# Patient Record
Sex: Female | Born: 1970 | Race: Black or African American | Hispanic: No | Marital: Married | Smoking: Never smoker
Health system: Southern US, Community
[De-identification: ages and names within clinical notes are randomized; demographics above are authoritative.]

## PROBLEM LIST (undated history)

## (undated) DIAGNOSIS — Z973 Presence of spectacles and contact lenses: Secondary | ICD-10-CM

## (undated) DIAGNOSIS — D259 Leiomyoma of uterus, unspecified: Secondary | ICD-10-CM

## (undated) DIAGNOSIS — J45909 Unspecified asthma, uncomplicated: Secondary | ICD-10-CM

## (undated) DIAGNOSIS — F419 Anxiety disorder, unspecified: Secondary | ICD-10-CM

## (undated) DIAGNOSIS — D509 Iron deficiency anemia, unspecified: Secondary | ICD-10-CM

## (undated) HISTORY — PX: NO PAST SURGERIES: SHX2092

---

## 2006-12-30 ENCOUNTER — Ambulatory Visit: Payer: Self-pay | Admitting: *Deleted

## 2006-12-30 ENCOUNTER — Encounter (INDEPENDENT_AMBULATORY_CARE_PROVIDER_SITE_OTHER): Payer: Self-pay | Admitting: Nurse Practitioner

## 2006-12-30 ENCOUNTER — Ambulatory Visit: Payer: Self-pay | Admitting: Family Medicine

## 2006-12-30 LAB — CONVERTED CEMR LAB
ALT: 14 units/L (ref 0–35)
AST: 17 units/L (ref 0–37)
Albumin: 4.3 g/dL (ref 3.5–5.2)
Alkaline Phosphatase: 60 units/L (ref 39–117)
Basophils Absolute: 0 10*3/uL (ref 0.0–0.1)
Basophils Relative: 1 % (ref 0–1)
Chloride: 106 meq/L (ref 96–112)
Eosinophils Absolute: 0.1 10*3/uL (ref 0.0–0.7)
HDL: 43 mg/dL (ref >39)
LDL Cholesterol: 130 mg/dL — ABNORMAL HIGH (ref 0–99)
MCHC: 32.9 g/dL (ref 30.0–36.0)
MCV: 64.7 fL — ABNORMAL LOW (ref 78.0–100.0)
Neutro Abs: 3.4 10*3/uL (ref 1.7–7.7)
Neutrophils Relative %: 51 % (ref 43–77)
Platelets: 262 10*3/uL (ref 150–400)
Potassium: 4.3 meq/L (ref 3.5–5.3)
Sodium: 140 meq/L (ref 135–145)
TSH: 1.847 microintl units/mL (ref 0.350–5.50)
Total Protein: 7.7 g/dL (ref 6.0–8.3)

## 2007-01-31 ENCOUNTER — Encounter (INDEPENDENT_AMBULATORY_CARE_PROVIDER_SITE_OTHER): Payer: Self-pay | Admitting: Nurse Practitioner

## 2007-01-31 ENCOUNTER — Ambulatory Visit: Payer: Self-pay | Admitting: Internal Medicine

## 2007-01-31 LAB — CONVERTED CEMR LAB: Preg, Serum: POSITIVE

## 2008-08-13 ENCOUNTER — Ambulatory Visit: Payer: Self-pay | Admitting: Internal Medicine

## 2008-08-16 ENCOUNTER — Ambulatory Visit (HOSPITAL_COMMUNITY): Admission: RE | Admit: 2008-08-16 | Discharge: 2008-08-16 | Payer: Self-pay | Admitting: Internal Medicine

## 2008-11-30 ENCOUNTER — Inpatient Hospital Stay (HOSPITAL_COMMUNITY): Admission: AD | Admit: 2008-11-30 | Discharge: 2008-11-30 | Payer: Self-pay | Admitting: Obstetrics & Gynecology

## 2008-12-03 ENCOUNTER — Encounter (INDEPENDENT_AMBULATORY_CARE_PROVIDER_SITE_OTHER): Payer: Self-pay | Admitting: Adult Health

## 2008-12-03 ENCOUNTER — Ambulatory Visit: Payer: Self-pay | Admitting: Internal Medicine

## 2008-12-03 LAB — CONVERTED CEMR LAB
ALT: 13 units/L (ref 0–35)
Alkaline Phosphatase: 56 units/L (ref 39–117)
Basophils Relative: 1 % (ref 0–1)
CO2: 21 meq/L (ref 19–32)
Lymphocytes Relative: 38 % (ref 12–46)
MCHC: 31.1 g/dL (ref 30.0–36.0)
Monocytes Relative: 9 % (ref 3–12)
Neutro Abs: 3.8 10*3/uL (ref 1.7–7.7)
Neutrophils Relative %: 51 % (ref 43–77)
RBC: 5.33 M/uL — ABNORMAL HIGH (ref 3.87–5.11)
Sodium: 137 meq/L (ref 135–145)
TSH: 2.192 microintl units/mL (ref 0.350–4.500)
Total Bilirubin: 0.5 mg/dL (ref 0.3–1.2)
Total Protein: 8 g/dL (ref 6.0–8.3)
WBC: 7.5 10*3/uL (ref 4.0–10.5)

## 2009-01-02 ENCOUNTER — Encounter (INDEPENDENT_AMBULATORY_CARE_PROVIDER_SITE_OTHER): Payer: Self-pay | Admitting: Adult Health

## 2009-01-02 ENCOUNTER — Ambulatory Visit: Payer: Self-pay | Admitting: Internal Medicine

## 2009-01-02 LAB — CONVERTED CEMR LAB
Basophils Relative: 0 % (ref 0–1)
Eosinophils Absolute: 0.1 10*3/uL (ref 0.0–0.7)
Hemoglobin: 9.7 g/dL — ABNORMAL LOW (ref 12.0–15.0)
MCHC: 32.2 g/dL (ref 30.0–36.0)
MCV: 61.3 fL — ABNORMAL LOW (ref 78.0–100.0)
Monocytes Absolute: 1.1 10*3/uL — ABNORMAL HIGH (ref 0.1–1.0)
Monocytes Relative: 13 % — ABNORMAL HIGH (ref 3–12)
RBC: 4.91 M/uL (ref 3.87–5.11)

## 2009-02-07 ENCOUNTER — Encounter (INDEPENDENT_AMBULATORY_CARE_PROVIDER_SITE_OTHER): Payer: Self-pay | Admitting: Internal Medicine

## 2009-02-07 ENCOUNTER — Ambulatory Visit: Payer: Self-pay | Admitting: Internal Medicine

## 2009-02-07 LAB — CONVERTED CEMR LAB
Basophils Absolute: 0 10*3/uL (ref 0.0–0.1)
Eosinophils Absolute: 0.1 10*3/uL (ref 0.0–0.7)
Eosinophils Relative: 1 % (ref 0–5)
HCT: 32.3 % — ABNORMAL LOW (ref 36.0–46.0)
Lymphs Abs: 2.7 10*3/uL (ref 0.7–4.0)
MCV: 65 fL — ABNORMAL LOW (ref 78.0–100.0)
Platelets: 221 10*3/uL (ref 150–400)
RDW: 25.3 % — ABNORMAL HIGH (ref 11.5–15.5)

## 2010-06-10 IMAGING — RF DG BE W/ CM - WO/W KUB
9 series · 9 of 9 positions shown · non-contrast
Comparison: None.

CLINICAL DATA: 37-year-old female with history of constipation.

AIR CONTRAST BARIUM ENEMA
TECHNIQUE: Initial scout AP supine abdominal image obtained to
insure adequate colon cleansing. Barium was introduced into the
colon in a retrograde fashion and refluxed from the rectum to the
distal transverse colon. As much of the barium as possible was then
removed through the indwelling tube via gravity drain. Air was then
insufflated into the colon. Spot images of the colon followed by
overhead radiographs were obtained.
Fluoroscopy time:  2.2 minutes

[Series 1: run · 1 of 1 slices shown (1 of 9)]
[im 1/1]
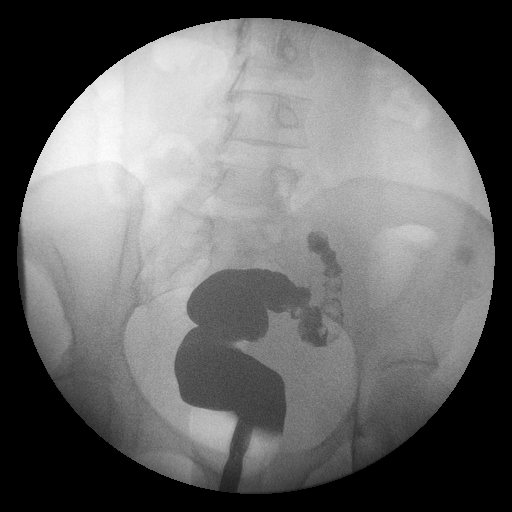

[Series 2: run · 1 of 1 slices shown (2 of 9)]
[im 1/1]
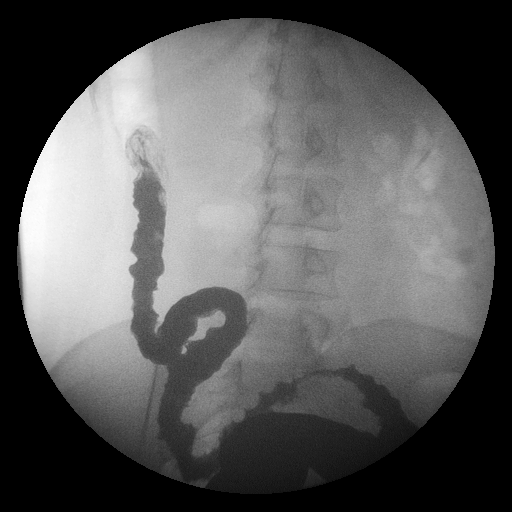

[Series 3: run · 1 of 1 slices shown (3 of 9)]
[im 1/1]
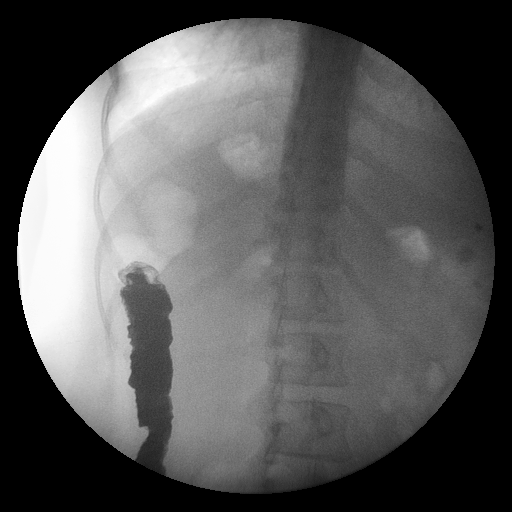

[Series 4: run · 1 of 1 slices shown (4 of 9)]
[im 1/1]
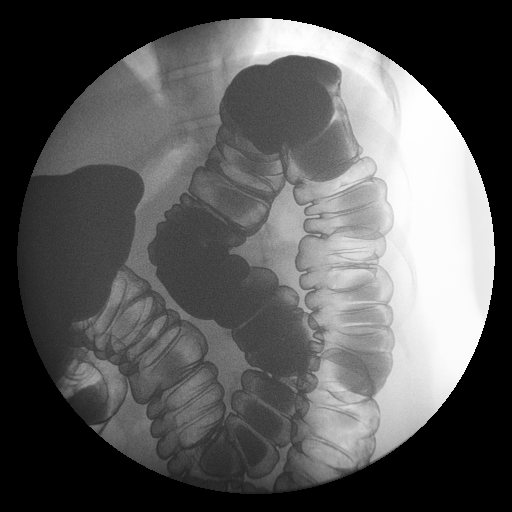

[Series 5: run · 1 of 1 slices shown (5 of 9)]
[im 1/1]
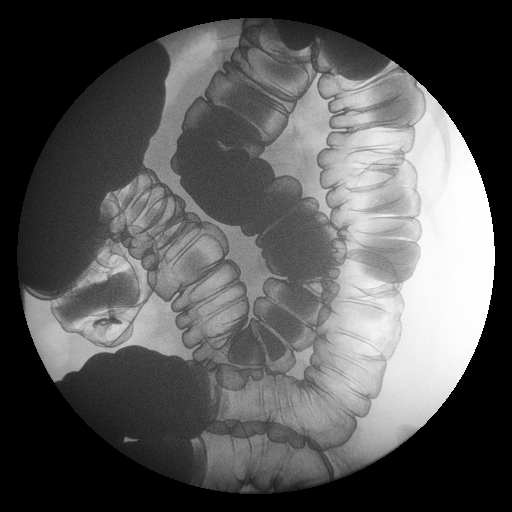

[Series 6: run · 1 of 1 slices shown (6 of 9)]
[im 1/1]
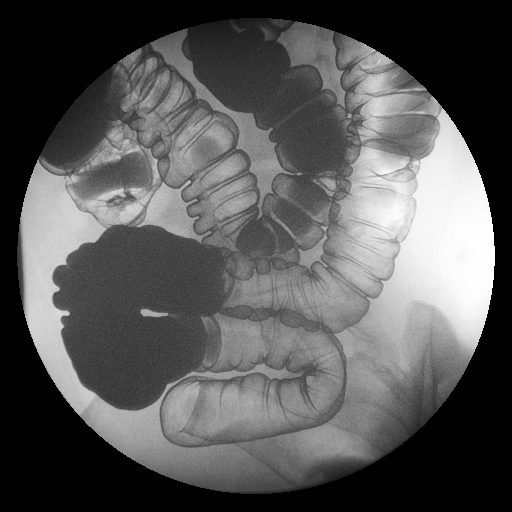

[Series 7: run · 1 of 1 slices shown (7 of 9)]
[im 1/1]
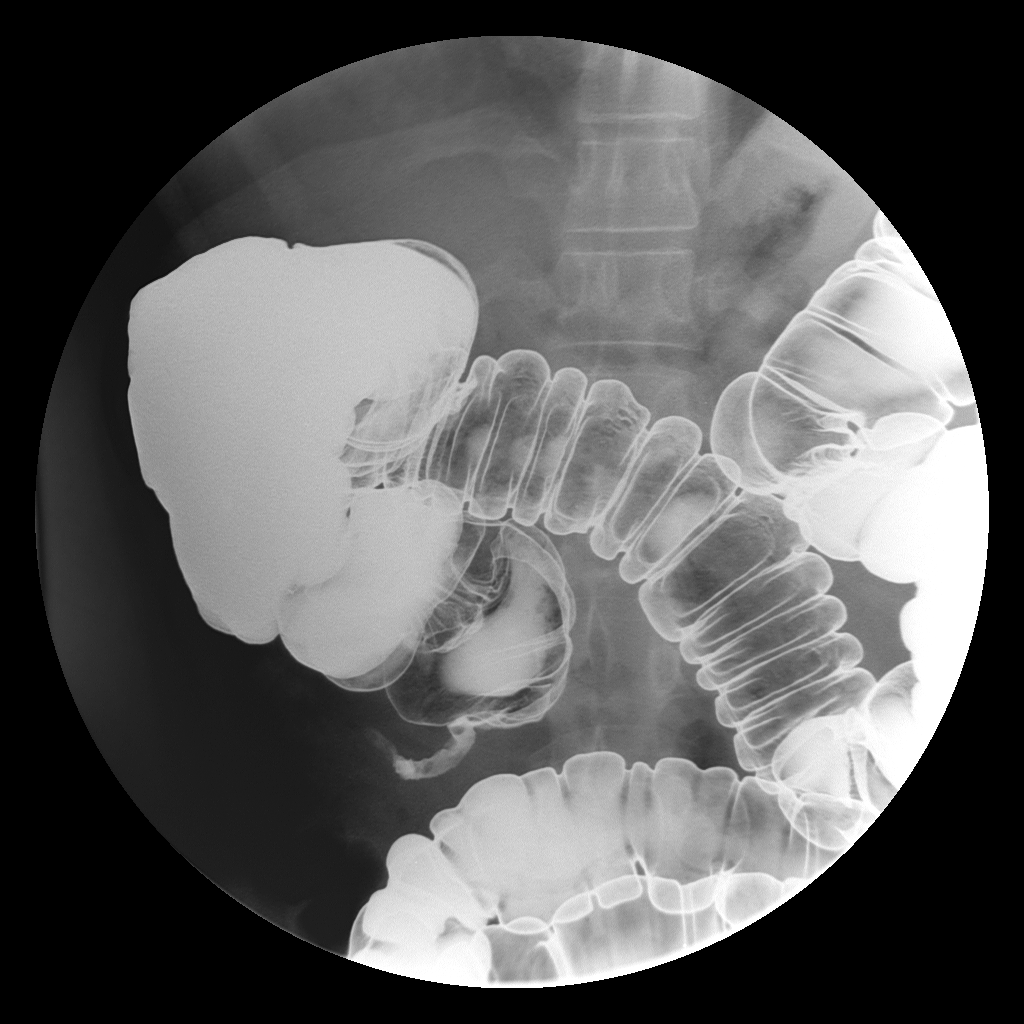

[Series 8: run · 1 of 1 slices shown (8 of 9)]
[im 1/1]
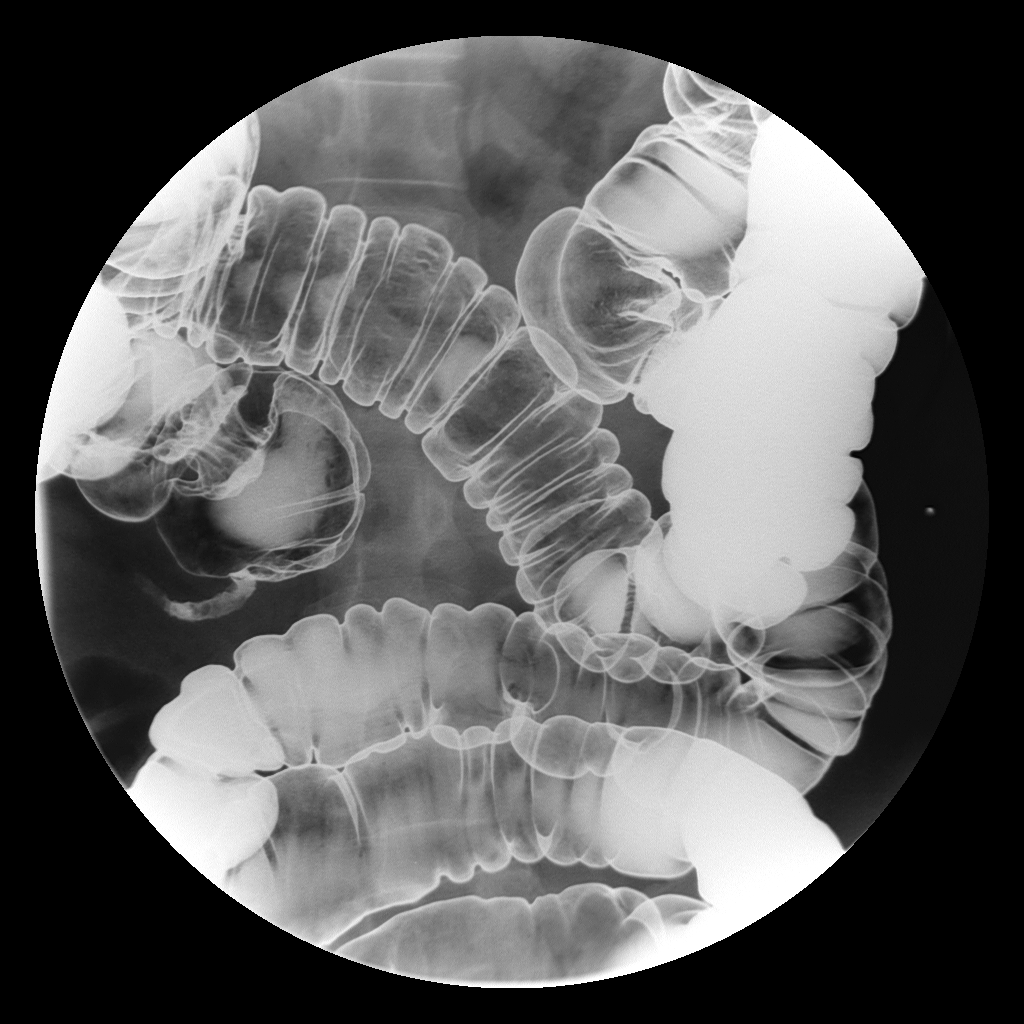

[Series 9: run · 1 of 1 slices shown (9 of 9)]
[im 1/1]
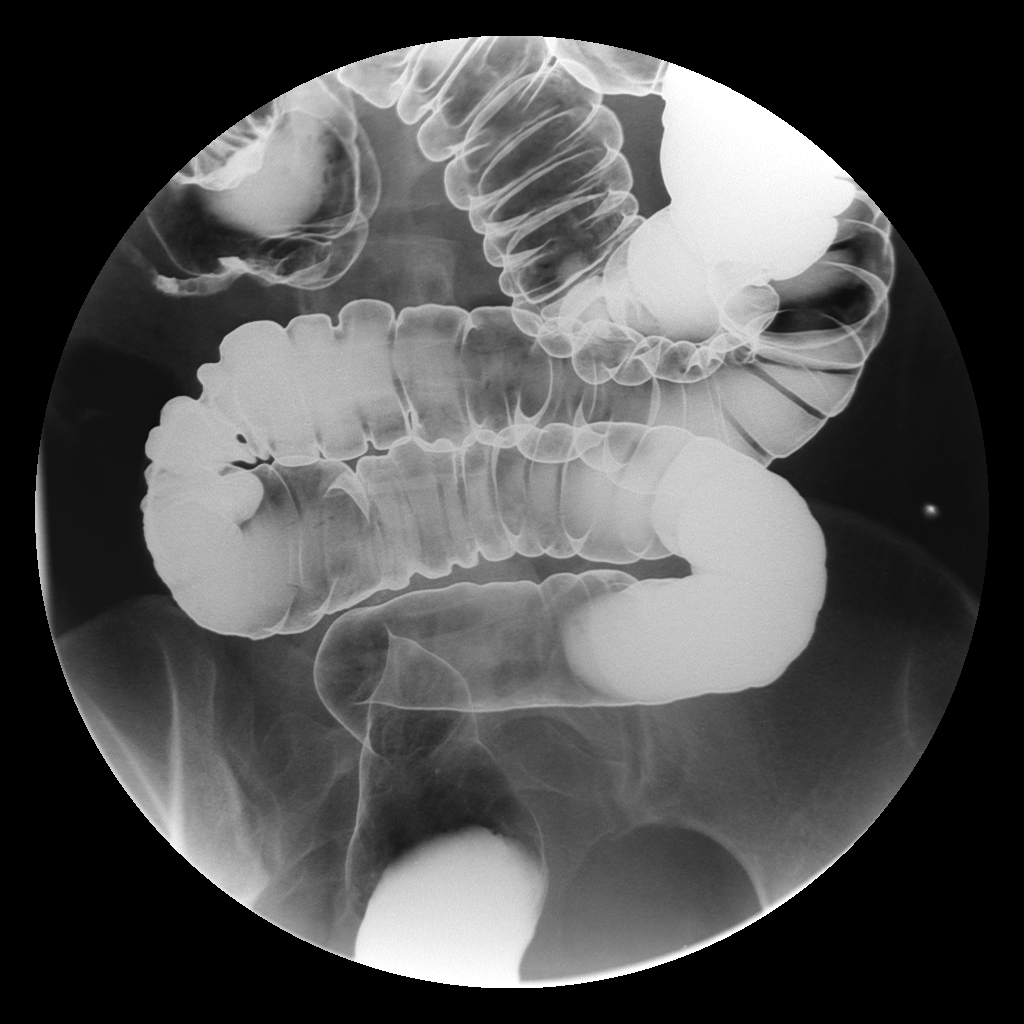

[9 of 9 positions shown; findings below may reference images not displayed]

FINDINGS: Preprocedural AP scout view of the abdomen demonstrate a
normal bowel gas pattern without significant retained stool.
Visceral contours are within normal limits.  There is mild rotatory
lumbar scoliosis. No acute osseous abnormality identified.

The patient speaks French and her husband was utilized to
translate.  A double contrast study was undertaken and the patient
tolerated this well.  There was no obstruction to the retrograde
flow of contrast throughout the colon.  Moderate to severely
redundant colon was noted, particularly the sigmoid and transverse
segments.  Barium was allowed to drain and air was introduced.
Suboptimal drainage of barium occurred, in part due to the
redundancy.

Multiple double contrast overhead and fluoroscopic spot views of
the colon were then obtained. Reflux of contrast into the appendix
is noted.  There is no small bowel reflux.  No contour deforming
mass, stricture, or polyp is identified.  No diverticula.

The patient was given appropriate post procedural instructions and
milk of magnesia to facilitate evacuation of the residual barium.
She was discharged to home in good condition.
IMPRESSION: Negative aside from moderate to severe redundancy of the colon,
particularly the sigmoid and transverse segment.

## 2014-04-24 ENCOUNTER — Inpatient Hospital Stay (HOSPITAL_COMMUNITY): Admission: RE | Admit: 2014-04-24 | Payer: Self-pay | Source: Ambulatory Visit

## 2014-05-16 ENCOUNTER — Encounter (HOSPITAL_BASED_OUTPATIENT_CLINIC_OR_DEPARTMENT_OTHER): Payer: Self-pay | Admitting: *Deleted

## 2014-05-17 ENCOUNTER — Encounter (HOSPITAL_BASED_OUTPATIENT_CLINIC_OR_DEPARTMENT_OTHER): Payer: Self-pay | Admitting: *Deleted

## 2014-05-17 NOTE — Progress Notes (Addendum)
NPO AFTER MN. ARRIVE AT [id number]. NEEDS CBC, CMET, AND URINE PREG.  AT FIRST I USED PACIFIC INTERPRETOR BUT PT STATES SHE UNDERSTANDS ENGLISH AND DID NOT  NEED INTERPRETOR AND WANTS TO TALK TO HER [facility]ANCE , [employer], I WAS ABLE TO GET HISTORY DONE BUT WILL CALL  HIM WITH DIRECTIONS AND TIME.  SHE ALSO REFUSED NEEDING INTERPRETOR DOS , STATES [employer] CAN INTERPRET DOS IF NEEDED. CALL AND SPOKE W/ HEATHER AT OFFICE SHE STATES THEY MOSTLY HAVE BEEN SPEAKING TO [employer] PER PT WANTS. HEATHER STATES THEY ARE STILL WORKING ON HER INSURANCE APPROVAL FOR SURGERY, SHE IS TO CALL BACK LATER UPDATE.   HAVE NOT HEARD BACK FROM PT [employer]ANCE. CALLED PT BACK VERIFIED SHE HAD PHYSICAL ADDRESS AND DIRECTIONS.  ALSO, HAVE NOT HEARD FROM OFFICE IF INSURANCE APPROVED SURGERY.

## 2014-05-20 ENCOUNTER — Ambulatory Visit (HOSPITAL_BASED_OUTPATIENT_CLINIC_OR_DEPARTMENT_OTHER): Admission: RE | Admit: 2014-05-20 | Payer: 59 | Source: Ambulatory Visit | Admitting: Obstetrics & Gynecology

## 2014-05-20 HISTORY — DX: Presence of spectacles and contact lenses: Z97.3

## 2014-05-20 HISTORY — DX: Leiomyoma of uterus, unspecified: D25.9

## 2014-05-20 HISTORY — DX: Iron deficiency anemia, unspecified: D50.9

## 2014-05-20 SURGERY — HYSTERECTOMY, ABDOMINAL
Anesthesia: Choice

## 2014-06-24 NOTE — H&P (Signed)
Katelyn Lawson is an 44 y.o. female with fibroid uterus and anemia.  She was counseled regarding tx options including medication (OCP, Depo Lupron) vs [place] vs myomectomy/hysterectomy.  The patient has elected to proceed with hysterectomy as she has definitely completed childbearing.  Uterus is about 18 weeks sized and the patient has had no previous abdominal surgeries.  Pertinent Gynecological History: Menses: flow is excessive with use of 8 pads or tampons on heaviest days Bleeding: heavy Contraception: none DES exposure: unknown Blood transfusions: none Sexually transmitted diseases: no past history Previous GYN Procedures: none  Last MMG: normal Date: 2015 Last pap: normal Date: 2014 OB History: G2, P2   Menstrual History: Menarche age: n/a No LMP recorded.    Past Medical History  Diagnosis Date  . Uterine fibroid   . Iron deficiency anemia   . Wears glasses     Past Surgical History  Procedure Laterality Date  . No past surgeries      No family history on file.  Social History:  reports that she has never smoked. She has never used smokeless tobacco. She reports that she does not drink alcohol or use illicit drugs.  Allergies: No Known Allergies  No prescriptions prior to admission    ROS  There were no vitals taken for this visit. Physical Exam  Constitutional: She is oriented to person, place, and time. She appears well-developed and well-nourished.  GI: Soft. She exhibits mass. There is no rebound and no guarding.  Neurological: She is alert and oriented to person, place, and time.  Skin: Skin is warm and dry.  Psychiatric: She has a normal mood and affect. Her behavior is normal.    No results found for this or any previous visit (from the past 24 hour(s)).  No results found.  Assessment/Plan: 44yo G2P2 with enlarged fibroid uterus and anemia -TAH -Patient is counseled re: risk of bleeding, infection, scarring and damage to surrounding organs.  The  patient understands that this completes her childbearing potential.    Bathsheba Durrett 06/24/2014, 8:56 PM

## 2014-06-25 NOTE — Progress Notes (Signed)
SPOKE W/ PT AND FIANCE, PT DOES NOT SPEAK ENGLISH WELL, SO FIANCE, [employer] WILL INTERPRET DOS.  NPO AFTER MN. ARRIVE AT [id number]. NEEDS CBC, CMET, URINE PREG.

## 2014-06-27 ENCOUNTER — Ambulatory Visit (HOSPITAL_BASED_OUTPATIENT_CLINIC_OR_DEPARTMENT_OTHER): Payer: 59 | Admitting: Anesthesiology

## 2014-06-27 ENCOUNTER — Encounter (HOSPITAL_BASED_OUTPATIENT_CLINIC_OR_DEPARTMENT_OTHER): Payer: Self-pay | Admitting: *Deleted

## 2014-06-27 ENCOUNTER — Encounter (HOSPITAL_COMMUNITY)
Admission: RE | Disposition: A | Discharge status: Home / Self Care | Payer: Self-pay | Source: Ambulatory Visit | Attending: Obstetrics & Gynecology

## 2014-06-27 ENCOUNTER — Inpatient Hospital Stay (HOSPITAL_BASED_OUTPATIENT_CLINIC_OR_DEPARTMENT_OTHER)
Admission: RE | Admit: 2014-06-27 | Discharge: 2014-06-29 | DRG: 743 | Disposition: A | Discharge status: Home / Self Care | Payer: 59 | Source: Ambulatory Visit | Attending: Obstetrics & Gynecology | Admitting: Obstetrics & Gynecology

## 2014-06-27 DIAGNOSIS — D509 Iron deficiency anemia, unspecified: Secondary | ICD-10-CM | POA: Diagnosis present

## 2014-06-27 DIAGNOSIS — N924 Excessive bleeding in the premenopausal period: Secondary | ICD-10-CM | POA: Diagnosis present

## 2014-06-27 DIAGNOSIS — Z90710 Acquired absence of both cervix and uterus: Secondary | ICD-10-CM | POA: Diagnosis present

## 2014-06-27 DIAGNOSIS — D259 Leiomyoma of uterus, unspecified: Principal | ICD-10-CM | POA: Diagnosis present

## 2014-06-27 HISTORY — PX: ABDOMINAL HYSTERECTOMY: SHX81

## 2014-06-27 LAB — COMPREHENSIVE METABOLIC PANEL
ALBUMIN: 4.4 g/dL (ref 3.5–5.2)
ALT: 25 U/L (ref 0–35)
ANION GAP: 9 (ref 5–15)
AST: 25 U/L (ref 0–37)
Alkaline Phosphatase: 58 U/L (ref 39–117)
BILIRUBIN TOTAL: 0.5 mg/dL (ref 0.3–1.2)
BUN: 12 mg/dL (ref 6–23)
CALCIUM: 9.1 mg/dL (ref 8.4–10.5)
CHLORIDE: 105 mmol/L (ref 96–112)
CO2: 26 mmol/L (ref 19–32)
CREATININE: 0.74 mg/dL (ref 0.50–1.10)
GFR calc Af Amer: >90 mL/min (ref >90)
GFR calc non Af Amer: >90 mL/min (ref >90)
Glucose, Bld: 95 mg/dL (ref 70–99)
Potassium: 3.8 mmol/L (ref 3.5–5.1)
Sodium: 140 mmol/L (ref 135–145)
Total Protein: 7.8 g/dL (ref 6.0–8.3)

## 2014-06-27 LAB — CBC
HCT: 33.6 % — ABNORMAL LOW (ref 36.0–46.0)
HEMOGLOBIN: 11.5 g/dL — AB (ref 12.0–15.0)
MCH: 24.4 pg — ABNORMAL LOW (ref 26.0–34.0)
MCHC: 34.2 g/dL (ref 30.0–36.0)
MCV: 71.3 fL — ABNORMAL LOW (ref 78.0–100.0)
PLATELETS: 235 10*3/uL (ref 150–400)
RBC: 4.71 MIL/uL (ref 3.87–5.11)
RDW: 19 % — ABNORMAL HIGH (ref 11.5–15.5)
WBC: 6.4 10*3/uL (ref 4.0–10.5)

## 2014-06-27 LAB — TYPE AND SCREEN
ABO/RH(D): A POS
ANTIBODY SCREEN: NEGATIVE

## 2014-06-27 LAB — POCT PREGNANCY, URINE: Preg Test, Ur: NEGATIVE

## 2014-06-27 LAB — ABO/RH: ABO/RH(D): A POS

## 2014-06-27 SURGERY — HYSTERECTOMY, ABDOMINAL
Anesthesia: General | Site: Abdomen

## 2014-06-27 MED ORDER — DEXTROSE IN LACTATED RINGERS 5 % IV SOLN
INTRAVENOUS | Status: DC
Start: 2014-06-27 — End: 2014-06-29
  Administered 2014-06-27: 14:00:00 via INTRAVENOUS
  Administered 2014-06-27: 1000 mL via INTRAVENOUS
  Administered 2014-06-28: 125 mL/h via INTRAVENOUS

## 2014-06-27 MED ORDER — METOCLOPRAMIDE HCL 5 MG/ML IJ SOLN
INTRAMUSCULAR | Status: DC | PRN
  Administered 2014-06-27: 10 mg via INTRAVENOUS

## 2014-06-27 MED ORDER — CEFAZOLIN SODIUM-DEXTROSE 2-3 GM-% IV SOLR
2.0000 g | INTRAVENOUS | Status: AC
  Administered 2014-06-27: 2 g via INTRAVENOUS
  Filled 2014-06-27: qty 50

## 2014-06-27 MED ORDER — SCOPOLAMINE 1 MG/3DAYS TD PT72
MEDICATED_PATCH | TRANSDERMAL | Status: AC
  Filled 2014-06-27: qty 1

## 2014-06-27 MED ORDER — ONDANSETRON HCL 4 MG/2ML IJ SOLN
INTRAMUSCULAR | Status: DC | PRN
  Administered 2014-06-27: 4 mg via INTRAVENOUS

## 2014-06-27 MED ORDER — LIDOCAINE HCL (CARDIAC) 20 MG/ML IV SOLN
INTRAVENOUS | Status: DC | PRN
  Administered 2014-06-27: 60 mg via INTRAVENOUS

## 2014-06-27 MED ORDER — KETOROLAC TROMETHAMINE 30 MG/ML IJ SOLN
INTRAMUSCULAR | Status: DC | PRN
  Administered 2014-06-27: 30 mg via INTRAVENOUS

## 2014-06-27 MED ORDER — BISACODYL 10 MG RE SUPP: 10.0000 mg | Freq: Every day | RECTAL | Status: DC | PRN

## 2014-06-27 MED ORDER — DEXTROSE IN LACTATED RINGERS 5 % IV SOLN
INTRAVENOUS | Status: DC
  Filled 2014-06-27: qty 1000

## 2014-06-27 MED ORDER — OXYCODONE HCL 5 MG/5ML PO SOLN
5.0000 mg | Freq: Once | ORAL | Status: DC | PRN
  Filled 2014-06-27: qty 5

## 2014-06-27 MED ORDER — LACTATED RINGERS IV SOLN
INTRAVENOUS | Status: DC
  Administered 2014-06-27 (×3): via INTRAVENOUS
  Filled 2014-06-27: qty 1000

## 2014-06-27 MED ORDER — SCOPOLAMINE 1 MG/3DAYS TD PT72
1.0000 | MEDICATED_PATCH | TRANSDERMAL | Status: DC
  Administered 2014-06-27: 1.5 mg via TRANSDERMAL
  Filled 2014-06-27: qty 1

## 2014-06-27 MED ORDER — SODIUM CHLORIDE 0.9 % IV SOLN
INTRAVENOUS | Status: DC | PRN
  Administered 2014-06-27: 40 mL

## 2014-06-27 MED ORDER — PROPOFOL 10 MG/ML IV BOLUS
INTRAVENOUS | Status: DC | PRN
  Administered 2014-06-27: 200 mg via INTRAVENOUS

## 2014-06-27 MED ORDER — GLYCOPYRROLATE 0.2 MG/ML IJ SOLN
INTRAMUSCULAR | Status: DC | PRN
  Administered 2014-06-27: 0.6 mg via INTRAVENOUS

## 2014-06-27 MED ORDER — DOCUSATE SODIUM 100 MG PO CAPS
100.0000 mg | ORAL_CAPSULE | Freq: Two times a day (BID) | ORAL | Status: DC
  Administered 2014-06-27 – 2014-06-29 (×4): 100 mg via ORAL
  Filled 2014-06-27 (×5): qty 1

## 2014-06-27 MED ORDER — SIMETHICONE 80 MG PO CHEW
80.0000 mg | CHEWABLE_TABLET | Freq: Four times a day (QID) | ORAL | Status: DC | PRN
  Filled 2014-06-27: qty 1

## 2014-06-27 MED ORDER — FENTANYL CITRATE 0.05 MG/ML IJ SOLN
INTRAMUSCULAR | Status: AC
  Filled 2014-06-27: qty 4

## 2014-06-27 MED ORDER — HYDROMORPHONE HCL 1 MG/ML IJ SOLN
INTRAMUSCULAR | Status: AC
  Filled 2014-06-27: qty 1

## 2014-06-27 MED ORDER — ACETAMINOPHEN 10 MG/ML IV SOLN
INTRAVENOUS | Status: DC | PRN
  Administered 2014-06-27: 1000 mg via INTRAVENOUS

## 2014-06-27 MED ORDER — ONDANSETRON HCL 4 MG/2ML IJ SOLN: 4.0000 mg | Freq: Four times a day (QID) | INTRAMUSCULAR | Status: DC | PRN

## 2014-06-27 MED ORDER — OXYCODONE HCL 5 MG PO TABS
5.0000 mg | ORAL_TABLET | Freq: Once | ORAL | Status: DC | PRN
  Filled 2014-06-27: qty 1

## 2014-06-27 MED ORDER — KETOROLAC TROMETHAMINE 30 MG/ML IJ SOLN
30.0000 mg | Freq: Four times a day (QID) | INTRAMUSCULAR | Status: DC
  Administered 2014-06-27 – 2014-06-28 (×5): 30 mg via INTRAVENOUS
  Filled 2014-06-27 (×11): qty 1

## 2014-06-27 MED ORDER — ROCURONIUM BROMIDE 100 MG/10ML IV SOLN
INTRAVENOUS | Status: DC | PRN
  Administered 2014-06-27: 10 mg via INTRAVENOUS
  Administered 2014-06-27: 40 mg via INTRAVENOUS

## 2014-06-27 MED ORDER — MENTHOL 3 MG MT LOZG
1.0000 | LOZENGE | OROMUCOSAL | Status: DC | PRN
  Filled 2014-06-27: qty 9

## 2014-06-27 MED ORDER — ALUM & MAG HYDROXIDE-SIMETH 200-200-20 MG/5ML PO SUSP
30.0000 mL | ORAL | Status: DC | PRN
Start: 2014-06-27 — End: 2014-06-29
  Administered 2014-06-29: 30 mL via ORAL
  Filled 2014-06-27: qty 30

## 2014-06-27 MED ORDER — CEFAZOLIN SODIUM-DEXTROSE 2-3 GM-% IV SOLR
INTRAVENOUS | Status: AC
  Filled 2014-06-27: qty 50

## 2014-06-27 MED ORDER — LIDOCAINE HCL 4 % MT SOLN
OROMUCOSAL | Status: DC | PRN
  Administered 2014-06-27: 3 mL via TOPICAL

## 2014-06-27 MED ORDER — MAGNESIUM HYDROXIDE 400 MG/5ML PO SUSP
30.0000 mL | Freq: Every day | ORAL | Status: DC | PRN
  Administered 2014-06-28: 30 mL via ORAL
  Filled 2014-06-27: qty 30

## 2014-06-27 MED ORDER — KETOROLAC TROMETHAMINE 30 MG/ML IJ SOLN
30.0000 mg | Freq: Four times a day (QID) | INTRAMUSCULAR | Status: DC
  Filled 2014-06-27 (×8): qty 1

## 2014-06-27 MED ORDER — HYDROMORPHONE HCL 1 MG/ML IJ SOLN
0.2500 mg | INTRAMUSCULAR | Status: DC | PRN
  Administered 2014-06-27 (×3): 0.25 mg via INTRAVENOUS
  Filled 2014-06-27: qty 1

## 2014-06-27 MED ORDER — ONDANSETRON HCL 4 MG PO TABS: 4.0000 mg | ORAL_TABLET | Freq: Four times a day (QID) | ORAL | Status: DC | PRN

## 2014-06-27 MED ORDER — HYDROMORPHONE HCL 1 MG/ML IJ SOLN
0.2000 mg | INTRAMUSCULAR | Status: DC | PRN
  Administered 2014-06-27 (×2): 0.6 mg via INTRAVENOUS
  Filled 2014-06-27 (×2): qty 1

## 2014-06-27 MED ORDER — SODIUM CHLORIDE 0.9 % IV SOLN
Freq: Once | INTRAVENOUS | Status: DC
  Filled 2014-06-27: qty 1000

## 2014-06-27 MED ORDER — MIDAZOLAM HCL 2 MG/2ML IJ SOLN
INTRAMUSCULAR | Status: AC
  Filled 2014-06-27: qty 2

## 2014-06-27 MED ORDER — PROMETHAZINE HCL 25 MG/ML IJ SOLN
6.2500 mg | INTRAMUSCULAR | Status: DC | PRN
Start: 2014-06-27 — End: 2014-06-27
  Filled 2014-06-27: qty 1

## 2014-06-27 MED ORDER — FENTANYL CITRATE 0.05 MG/ML IJ SOLN
INTRAMUSCULAR | Status: DC | PRN
  Administered 2014-06-27 (×8): 50 ug via INTRAVENOUS

## 2014-06-27 MED ORDER — MIDAZOLAM HCL 5 MG/5ML IJ SOLN
INTRAMUSCULAR | Status: DC | PRN
  Administered 2014-06-27: 2 mg via INTRAVENOUS

## 2014-06-27 MED ORDER — NEOSTIGMINE METHYLSULFATE 10 MG/10ML IV SOLN
INTRAVENOUS | Status: DC | PRN
  Administered 2014-06-27: 4 mg via INTRAVENOUS

## 2014-06-27 MED ORDER — OXYCODONE-ACETAMINOPHEN 5-325 MG PO TABS
1.0000 | ORAL_TABLET | ORAL | Status: DC | PRN
  Administered 2014-06-28 – 2014-06-29 (×2): 2 via ORAL
  Filled 2014-06-27 (×2): qty 2

## 2014-06-27 MED ORDER — DEXAMETHASONE SODIUM PHOSPHATE 4 MG/ML IJ SOLN
INTRAMUSCULAR | Status: DC | PRN
Start: 2014-06-27 — End: 2014-06-27
  Administered 2014-06-27: 10 mg via INTRAVENOUS

## 2014-06-27 SURGICAL SUPPLY — 65 items
BAG URINE DRAINAGE (UROLOGICAL SUPPLIES) ×2 IMPLANT
BENZOIN TINCTURE PRP APPL 2/3 (GAUZE/BANDAGES/DRESSINGS) ×2 IMPLANT
BLADE CLIPPER SURG (BLADE) IMPLANT
BLADE HEX COATED 2.75 (ELECTRODE) ×2 IMPLANT
CANISTER SUCT 3000ML (MISCELLANEOUS) ×2 IMPLANT
CATH FOLEY 2WAY SLVR  5CC 14FR (CATHETERS) ×1
CATH FOLEY 2WAY SLVR 5CC 14FR (CATHETERS) ×1 IMPLANT
COVER MAYO STAND STRL (DRAPES) ×2 IMPLANT
COVER TABLE BACK 60X90 (DRAPES) ×2 IMPLANT
DECANTER SPIKE VIAL GLASS SM (MISCELLANEOUS) IMPLANT
DRAPE LAPAROTOMY TRNSV 102X78 (DRAPE) ×2 IMPLANT
DRAPE WARM FLUID 44X44 (DRAPE) IMPLANT
DRESSING TELFA ISLAND 4X8 (GAUZE/BANDAGES/DRESSINGS) IMPLANT
DRSG OPSITE 6X11 MED (GAUZE/BANDAGES/DRESSINGS) ×2 IMPLANT
DRSG OPSITE POSTOP 4X10 (GAUZE/BANDAGES/DRESSINGS) ×2 IMPLANT
DURAPREP 26ML APPLICATOR (WOUND CARE) ×2 IMPLANT
ELECT BLADE 6.5 .24CM SHAFT (ELECTRODE) IMPLANT
ELECT LIGASURE LONG (ELECTRODE) ×2 IMPLANT
ELECT REM PT RETURN 9FT ADLT (ELECTROSURGICAL) ×2
ELECTRODE REM PT RTRN 9FT ADLT (ELECTROSURGICAL) ×1 IMPLANT
GAUZE SPONGE 4X4 16PLY XRAY LF (GAUZE/BANDAGES/DRESSINGS) IMPLANT
GLOVE BIO SURGEON STRL SZ 6 (GLOVE) ×2 IMPLANT
GLOVE BIOGEL M 6.5 STRL (GLOVE) ×4 IMPLANT
GLOVE BIOGEL PI IND STRL 6 (GLOVE) ×3 IMPLANT
GLOVE BIOGEL PI IND STRL 7.0 (GLOVE) ×3 IMPLANT
GLOVE BIOGEL PI IND STRL 7.5 (GLOVE) ×2 IMPLANT
GLOVE BIOGEL PI INDICATOR 6 (GLOVE) ×3
GLOVE BIOGEL PI INDICATOR 7.0 (GLOVE) ×3
GLOVE BIOGEL PI INDICATOR 7.5 (GLOVE) ×2
GLOVE SURG ORTHO 8.0 STRL STRW (GLOVE) ×2 IMPLANT
GOWN STRL REUS W/TWL LRG LVL3 (GOWN DISPOSABLE) ×4 IMPLANT
GOWN STRL REUS W/TWL XL LVL3 (GOWN DISPOSABLE) ×4 IMPLANT
HEMOSTAT SURGICEL 4X8 (HEMOSTASIS) IMPLANT
HOLDER FOLEY CATH W/STRAP (MISCELLANEOUS) ×2 IMPLANT
NEEDLE HYPO 22GX1.5 SAFETY (NEEDLE) ×2 IMPLANT
NEEDLE HYPO 25X1 1.5 SAFETY (NEEDLE) IMPLANT
NS IRRIG 500ML POUR BTL (IV SOLUTION) ×6 IMPLANT
PACK BASIN DAY SURGERY FS (CUSTOM PROCEDURE TRAY) ×2 IMPLANT
PAD OB MATERNITY 4.3X12.25 (PERSONAL CARE ITEMS) ×2 IMPLANT
PAD PREP 24X48 CUFFED NSTRL (MISCELLANEOUS) ×2 IMPLANT
PENCIL BUTTON HOLSTER BLD 10FT (ELECTRODE) ×2 IMPLANT
SPONGE GAUZE 4X4 12PLY STER LF (GAUZE/BANDAGES/DRESSINGS) ×2 IMPLANT
SPONGE LAP 18X18 X RAY DECT (DISPOSABLE) ×6 IMPLANT
STAPLER VISISTAT 35W (STAPLE) IMPLANT
STRIP CLOSURE SKIN 1/2X4 (GAUZE/BANDAGES/DRESSINGS) ×2 IMPLANT
SUT CHROMIC 2 0 TIES 18 (SUTURE) IMPLANT
SUT MNCRL 0 MO-4 VIOLET 18 CR (SUTURE) ×1 IMPLANT
SUT MNCRL 0 VIOLET 6X18 (SUTURE) ×1 IMPLANT
SUT MON AB 2-0 CT1 36 (SUTURE) IMPLANT
SUT MON AB-0 CT1 36 (SUTURE) ×2 IMPLANT
SUT MONOCRYL 0 6X18 (SUTURE) ×1
SUT MONOCRYL 0 MO 4 18  CR/8 (SUTURE) ×1
SUT PDS AB 0 CTX 60 (SUTURE) IMPLANT
SUT PLAIN 2 0 XLH (SUTURE) IMPLANT
SUT VIC AB 0 CT1 36 (SUTURE) ×2 IMPLANT
SUT VIC AB 3-0 X1 27 (SUTURE) IMPLANT
SUT VIC AB 4-0 KS 27 (SUTURE) ×2 IMPLANT
SYR BULB IRRIGATION 50ML (SYRINGE) ×2 IMPLANT
SYR CONTROL 10ML LL (SYRINGE) ×2 IMPLANT
SYRINGE 10CC LL (SYRINGE) ×2 IMPLANT
TOWEL OR 17X24 6PK STRL BLUE (TOWEL DISPOSABLE) ×6 IMPLANT
TRAY DSU PREP LF (CUSTOM PROCEDURE TRAY) ×2 IMPLANT
TUBE CONNECTING 12X1/4 (SUCTIONS) ×2 IMPLANT
WATER STERILE IRR 500ML POUR (IV SOLUTION) ×2 IMPLANT
YANKAUER SUCT BULB TIP NO VENT (SUCTIONS) ×2 IMPLANT

## 2014-06-27 NOTE — Progress Notes (Signed)
No change to H&P. 

## 2014-06-27 NOTE — Anesthesia Procedure Notes (Signed)
Procedure Name: Intubation Date/Time: 06/27/2014 7:34 AM Performed by: Katelyn Lawson Pre-anesthesia Checklist: Patient identified, Emergency Drugs available, Suction available and Patient being monitored Patient Re-evaluated:Patient Re-evaluated prior to inductionOxygen Delivery Method: Circle System Utilized Preoxygenation: Pre-oxygenation with 100% oxygen Intubation Type: IV induction Ventilation: Mask ventilation without difficulty Laryngoscope Size: Mac and 3 Grade View: Grade I Tube type: Oral Tube size: 7.0 mm Number of attempts: 1 Airway Equipment and Method: Stylet and LTA kit utilized Placement Confirmation: ETT inserted through vocal cords under direct vision,  positive ETCO2 and breath sounds checked- equal and bilateral Secured at: 21 cm Tube secured with: Tape Dental Injury: Teeth and Oropharynx as per pre-operative assessment

## 2014-06-27 NOTE — Anesthesia Postprocedure Evaluation (Signed)
  Anesthesia Post-op Note  Patient: Katelyn Lawson  Procedure(s) Performed: Procedure(s): TOTAL HYSTERECTOMY ABDOMINAL (N/A)  Patient Location: PACU  Anesthesia Type:General  Level of Consciousness: awake and alert   Airway and Oxygen Therapy: Patient Spontanous Breathing  Post-op Pain: mild  Post-op Assessment: Post-op Vital signs reviewed  Post-op Vital Signs: Reviewed  Last Vitals:  Filed Vitals:   06/27/14 1246  BP: 117/64  Pulse: 69  Temp: 36.7 C  Resp: 16    Complications: No apparent anesthesia complications

## 2014-06-27 NOTE — Anesthesia Preprocedure Evaluation (Addendum)
Anesthesia Evaluation  Patient identified by MRN, date of birth, ID band Patient awake    Reviewed: Allergy & Precautions, NPO status , Patient's Chart, lab work & pertinent test results  Airway Mallampati: II  TM Distance: >3 FB Neck ROM: Full    Dental  (+) Teeth Intact, Dental Advisory Given   Pulmonary neg pulmonary ROS,  breath sounds clear to auscultation        Cardiovascular negative cardio ROS  Rhythm:Regular Rate:Normal     Neuro/Psych negative neurological ROS     GI/Hepatic negative GI ROS, Neg liver ROS,   Endo/Other  negative endocrine ROS  Renal/GU negative Renal ROS     Musculoskeletal negative musculoskeletal ROS (+)   Abdominal   Peds  Hematology  (+) anemia ,   Anesthesia Other Findings   Reproductive/Obstetrics                            Anesthesia Physical Anesthesia Plan  ASA: II  Anesthesia Plan: General   Post-op Pain Management:    Induction: Intravenous  Airway Management Planned: Oral ETT  Additional Equipment:   Intra-op Plan:   Post-operative Plan: Extubation in OR  Informed Consent: I have reviewed the patients History and Physical, chart, labs and discussed the procedure including the risks, benefits and alternatives for the proposed anesthesia with the patient or authorized representative who has indicated his/her understanding and acceptance.   Dental advisory given  Plan Discussed with: CRNA  Anesthesia Plan Comments:        Anesthesia Quick Evaluation

## 2014-06-27 NOTE — Transfer of Care (Signed)
Last Vitals:  Filed Vitals:   06/27/14 0623  BP: 107/62  Pulse: 63  Temp: 36.8 C  Resp: 16   Immediate Anesthesia Transfer of Care Note  Patient: Katelyn Lawson  Procedure(s) Performed: Procedure(s) (LRB): TOTAL HYSTERECTOMY ABDOMINAL (N/A)  Patient Location: PACU  Anesthesia Type: General  Level of Consciousness: awake, alert  and oriented  Airway & Oxygen Therapy: Patient Spontanous Breathing and Patient connected to nasal cannula oxygen  Post-op Assessment: Report given to PACU RN and Post -op Vital signs reviewed and stable  Post vital signs: Reviewed and stable  Complications: No apparent anesthesia complications

## 2014-06-27 NOTE — Progress Notes (Signed)
No current c/o.  No CP/SOB.  No nausea.  Good pain control.  VSS. AF. UOP clear yellow, >100cc/hr  Gen: A&O x 3 Abd: soft, mild distention.  Dressing clean and dry Ext: no c/c/e  POD#0 s/p TAH -Continue pain and nausea control -D/C Foley in am -AM labs pending -Saline lock in AM  Linda Hedges, DO

## 2014-06-27 NOTE — Op Note (Signed)
Katelyn Lawson PROCEDURE DATE: 06/27/2014  PREOPERATIVE DIAGNOSIS:  Symptomatic fibroids, anemia POSTOPERATIVE DIAGNOSIS:  Symptomatic fibroids, anemia SURGEON:   Dr. Linda Hedges  ASSISTANT:   Dr. Louretta Shorten OPERATION:  Total abdominal hysterectomy, bilateral salpingectomy ANESTHESIA:  General endotracheal.  INDICATIONS: The patient is a 44 y.o. with history of symptomatic uterine fibroids. The patient made a decision to undergo definite surgical treatment. On the preoperative visit, the risks, benefits, indications, and alternatives of the procedure were reviewed with the patient.  On the day of surgery, the risks of surgery were again discussed with the patient including but not limited to: bleeding which may require transfusion or reoperation; infection which may require antibiotics; injury to bowel, bladder, ureters or other surrounding organs; need for additional procedures; thromboembolic phenomenon, incisional problems and other postoperative/anesthesia complications. Written informed consent was obtained.    OPERATIVE FINDINGS: An 18 week size uterus with normal tubes and ovaries bilaterally.  ESTIMATED BLOOD LOSS: 300 ml SPECIMENS:  Uterus and fallopian tubes sent to pathology COMPLICATIONS:  None immediate.   DESCRIPTION OF PROCEDURE: The patient received intravenous antibiotics and had sequential compression devices applied to her lower extremities while in the preoperative area.   She was taken to the operating room and placed under general anesthesia without difficulty and found to be adequate.The abdomen and perineum were prepped and draped in a sterile manner, and a Foley catheter was inserted into the bladder and attached to constant drainage. After an adequate timeout was performed, a Pfannensteil skin incision was made. This incision was taken down to the fascia using electrocautery with care given to maintain good hemostasis. The fascia was incised in the midline and the fascial  incision was then extended bilaterally using electrocautery without difficulty. The fascia was then dissected off the underlying rectus muscles using blunt and sharp dissection. The rectus muscles were split bluntly in the midline and the peritoneum entered sharply without complication. This peritoneal incision was then extended superiorly and inferiorly with care given to prevent bowel or bladder injury. Upon entry into the abdominal cavity, the upper abdomen was inspected and found to be normal. Attention was then turned to the pelvis. The uterus was elevated through the incision.  Using the Ligasure, The round ligaments on each side were clamped, cauterized, and transected with sharply allowing entry into the broad ligament. The anterior and posterior leaves of the broad ligament were separated and the ureters were inspected to be safely away from the area of dissection bilaterally. A hole was created in the clear portion of the posterior broad ligament. Adnexae were clamped on the patient's right side. This pedicle was then clamped, cut, and doubly suture ligated with good hemostasis. This procedure was repeated in an identical fashion on the left site allowing for both adnexa to remain in place.  A bladder flap was then created across the anterior leaf of the broad ligament and the bladder was then bluntly dissected off the lower uterine segment and cervix with good hemostasis. The uterine arteries were then skeletonized bilaterally and then clamped, cauterized and cut.  After 2 clamps past the uterine vessels, the uterus was amputated to allow better visualization.  The cervix was then removed using the Ligasure. The uterosacral ligaments were then clamped, cut, and suture ligated bilaterally.  Finally, the cardinal ligaments were clamped, cut, and ligated bilaterally.  Acutely curved clamps were placed across the vagina just under the cervix, and the specimen was amputated and sent to pathology. The vaginal  cuff was closed  with a series of interrupted 0 monocryl figure-of-eight sutures with care given to incorporate the anterior pubocervical fascia and the posterior rectovaginal fascia.  The vaginal cuff angles were noted to have good hemostasis. The remaining fallopian tubes were identified, clamped, cauterized and removed using the Ligasure. The pelvis was irrigated and hemostasis was reconfirmed at all pedicles and along the pelvic sidewall.  All laparotomy sponges and instruments were removed from the abdomen. The peritoneum was closed with 2-0 Vicryl, and the fascia was closed with 0 Vicryl in a running fashion. 40cc of Exparel (half and half) was injecting into the fascia and subcutaneous tissue. The skin was closed with a 4-0 Monocryl subcuticular stitch. Sponge, lap, needle, and instrument counts were correct times two. The patient was taken to the recovery area awake, extubated and in stable condition.

## 2014-06-28 LAB — COMPREHENSIVE METABOLIC PANEL
ALK PHOS: 43 U/L (ref 39–117)
ALT: 18 U/L (ref 0–35)
ANION GAP: 6 (ref 5–15)
AST: 20 U/L (ref 0–37)
Albumin: 3 g/dL — ABNORMAL LOW (ref 3.5–5.2)
BUN: 7 mg/dL (ref 6–23)
CO2: 25 mmol/L (ref 19–32)
Calcium: 8.4 mg/dL (ref 8.4–10.5)
Chloride: 108 mmol/L (ref 96–112)
Creatinine, Ser: 0.68 mg/dL (ref 0.50–1.10)
Glucose, Bld: 130 mg/dL — ABNORMAL HIGH (ref 70–99)
POTASSIUM: 3.8 mmol/L (ref 3.5–5.1)
SODIUM: 139 mmol/L (ref 135–145)
Total Bilirubin: 0.8 mg/dL (ref 0.3–1.2)
Total Protein: 5.7 g/dL — ABNORMAL LOW (ref 6.0–8.3)

## 2014-06-28 LAB — CBC
HCT: 24.4 % — ABNORMAL LOW (ref 36.0–46.0)
Hemoglobin: 8.5 g/dL — ABNORMAL LOW (ref 12.0–15.0)
MCH: 24.7 pg — AB (ref 26.0–34.0)
MCHC: 34.8 g/dL (ref 30.0–36.0)
MCV: 70.9 fL — ABNORMAL LOW (ref 78.0–100.0)
Platelets: 185 10*3/uL (ref 150–400)
RBC: 3.44 MIL/uL — ABNORMAL LOW (ref 3.87–5.11)
RDW: 19 % — AB (ref 11.5–15.5)
WBC: 12 10*3/uL — ABNORMAL HIGH (ref 4.0–10.5)

## 2014-06-28 NOTE — Progress Notes (Signed)
Pt presents with activity intolerance. BP 99/39, pulse 89, O2 sat 97% on room air. On-call notified at [facility]. MD came to floor with no new orders. Will continue to monitor.

## 2014-06-28 NOTE — Progress Notes (Signed)
C/O pain which is well-controlled with medication.  Tolerating clears without n/v.  Minimal activity.  Voiding well.    VSS.  AF. Hgb 11.5->8.5  Gen: A&O x 3 Abd: soft, ND, dressing c/d Ext: no c/c/e  POD#1 s/p TAH -Advance diet -Increase activity as tolerated -Continue pain and nausea control -Anticipate d/c tomorrow  Linda Hedges, DO

## 2014-06-29 MED ORDER — DOCUSATE SODIUM 100 MG PO CAPS: 100.0000 mg | ORAL_CAPSULE | Freq: Two times a day (BID) | ORAL | Status: AC

## 2014-06-29 MED ORDER — OXYCODONE-ACETAMINOPHEN 5-325 MG PO TABS: 1.0000 | ORAL_TABLET | ORAL | Status: DC | PRN

## 2014-06-29 MED ORDER — SIMETHICONE 80 MG PO CHEW
80.0000 mg | CHEWABLE_TABLET | Freq: Four times a day (QID) | ORAL | Status: DC | PRN
Start: 2014-06-29 — End: 2017-02-01

## 2014-06-29 NOTE — Progress Notes (Signed)
Patient has no c/o.  Pain well controlled.  Passing flatus.  Tolerating po.  Voiding without difficulty.    VSS.  AF. Gen: A&O x 3 Abd: soft, NT/ND Ext: no c/c/e  POD#2 s/p TAH -D/C home with f/u in 1 week  Linda Hedges, DO

## 2014-06-29 NOTE — Discharge Summary (Signed)
Physician Discharge Summary  Patient ID: Katelyn Lawson MRN: [id number] DOB/AGE: 1970/11/05 44 y.o.  Admit date: 06/27/2014 Discharge date: 06/29/2014  Admission Diagnoses:  Fibroid uterus, anemia  Discharge Diagnoses: SAA Active Problems:   S/P hysterectomy   Discharged Condition: good  Hospital Course: Admitted to [facility] outpatient for planned TAH, b/l salpingectomy which was performed without complication.  The patient did well postop and was meeting all goals on POD#2.  She was discharged home with f/u in office in 1 week.  Consults: None  Significant Diagnostic Studies: none  Treatments: surgery: TAH, bilateral salpingectomy  Discharge Exam: Blood pressure 108/57, pulse 73, temperature 98.7 F (37.1 C), temperature source Oral, resp. rate 16, height 5\' 6"  (1.676 m), weight 180 lb (81.647 kg), last menstrual period 06/18/2014, SpO2 100 %. General appearance: alert, cooperative and appears stated age GI: normal findings: appropriate ttp Extremities: extremities normal, atraumatic, no cyanosis or edema Incision/Wound: c/d dressing  Disposition: Final discharge disposition not confirmed     Medication List    TAKE these medications        docusate sodium 100 MG capsule  Commonly known as:  COLACE  Take 1 capsule (100 mg total) by mouth 2 (two) times daily.     ferrous sulfate 325 (65 FE) MG tablet  Take 325 mg by mouth daily with breakfast.     fexofenadine 180 MG tablet  Commonly known as:  ALLEGRA  Take 180 mg by mouth daily.     oxyCODONE-acetaminophen 5-325 MG per tablet  Commonly known as:  PERCOCET/ROXICET  Take 1-2 tablets by mouth every 4 (four) hours as needed (moderate to severe pain (when tolerating fluids)).     simethicone 80 MG chewable tablet  Commonly known as:  MYLICON  Chew 1 tablet (80 mg total) by mouth 4 (four) times daily as needed for flatulence.     Tetrahydroz-Glyc-Hyprom-PEG 0.05-0.2-0.36-1 % Soln  Place 1 drop into both eyes as needed (for  red eyes).     VITAMIN B-12 PO  Take 1 tablet by mouth daily.     VITAMIN C PO  Take 1 tablet by mouth daily.         SignedLinda Hedges 06/29/2014, 12:36 PM

## 2014-06-29 NOTE — Discharge Instructions (Signed)
Call MD for T>100.4, heavy vaginal bleeding, severe abdominal pain, intractable nausea and/or vomiting, or respiratory distress.  No driving while taking pain medication.  No heavy lifting.  Pelvic rest x 6 weeks.  Call office to schedule incision check in 1 week.

## 2014-07-01 ENCOUNTER — Encounter (HOSPITAL_BASED_OUTPATIENT_CLINIC_OR_DEPARTMENT_OTHER): Payer: Self-pay | Admitting: Obstetrics & Gynecology

## 2015-11-12 DIAGNOSIS — R102 Pelvic and perineal pain: Secondary | ICD-10-CM | POA: Diagnosis not present

## 2015-11-12 DIAGNOSIS — N390 Urinary tract infection, site not specified: Secondary | ICD-10-CM | POA: Diagnosis not present

## 2015-12-25 ENCOUNTER — Ambulatory Visit: Payer: Self-pay | Admitting: General Surgery

## 2015-12-25 DIAGNOSIS — R221 Localized swelling, mass and lump, neck: Secondary | ICD-10-CM | POA: Diagnosis not present

## 2015-12-25 NOTE — H&P (Signed)
History of Present Illness Katelyn Ok MD; 12/25/2015 10:04 AM) The patient is a 45 year old female who presents with a complaint of Mass. Patient represents today after been seen earlier this year. Patient masses continued without resolution. Patient states she is ready for surgery at this time.  ----------------------------------------------------------------------  Patient is a 45 year old female who comes in and is referred by Dr. Velna Hatchet for evaluation of a right neck mass. Patient states there for approximately 2 years. He states it has become more bothersome. She states there has been no drainage or infection.   Allergies Elbert Ewings, CMA; 12/25/2015 9:52 AM) No Known Drug Allergies02/22/2017  Medication History Elbert Ewings, CMA; 12/25/2015 9:52 AM) Ferrous Sulfate (325 (65 Fe)MG Tablet, Oral) Active. Medications Reconciled    Review of Systems Katelyn Ok, MD; 12/25/2015 10:3 AM) General Not Present- Appetite Loss, Chills, Fatigue, Fever, Night Sweats, Weight Gain and Weight Loss. Skin Not Present- Change in Wart/Mole, Dryness, Hives, Jaundice, New Lesions, Non-Healing Wounds, Rash and Ulcer. HEENT Not Present- Earache, Hearing Loss, Hoarseness, Nose Bleed, Oral Ulcers, Ringing in the Ears, Seasonal Allergies, Sinus Pain, Sore Throat, Visual Disturbances, Wears glasses/contact lenses and Yellow Eyes. Respiratory Not Present- Bloody sputum, Chronic Cough, Difficulty Breathing, Snoring and Wheezing. Breast Not Present- Breast Mass, Breast Pain, Nipple Discharge and Skin Changes. Gastrointestinal Present- Constipation. Not Present- Abdominal Pain, Bloating, Bloody Stool, Change in Bowel Habits, Chronic diarrhea, Difficulty Swallowing, Excessive gas, Gets full quickly at meals, Hemorrhoids, Indigestion, Nausea, Rectal Pain and Vomiting. Female Genitourinary Not Present- Frequency, Nocturia, Painful Urination, Pelvic Pain and Urgency. Musculoskeletal Not Present-  Back Pain, Joint Pain, Joint Stiffness, Muscle Pain, Muscle Weakness and Swelling of Extremities. Neurological Not Present- Decreased Memory, Fainting, Headaches, Numbness, Seizures, Tingling, Tremor, Trouble walking and Weakness. Psychiatric Not Present- Anxiety, Bipolar, Change in Sleep Pattern, Depression, Fearful and Frequent crying. Endocrine Not Present- Cold Intolerance, Excessive Hunger, Hair Changes, Heat Intolerance, Hot flashes and New Diabetes. Hematology Not Present- Easy Bruising, Excessive bleeding, Gland problems, HIV and Persistent Infections.  Vitals Elbert Ewings CMA; 12/25/2015 9:53 AM) 12/25/2015 9:52 AM Weight: 180 lb Height: 67in Body Surface Area: 1.93 m Body Mass Index: 28.19 kg/m  Temp.: 97.57F(Temporal)  Pulse: 63 (Regular)  BP: 128/78 (Sitting, Left Arm, Standard)       Physical Exam Katelyn Ok, MD; 12/25/2015 10:3 AM) General Mental Status-Alert. General Appearance-Consistent with stated age. Hydration-Well hydrated. Voice-Normal.  Head and Neck Note: Approximately 3-4 cm right lateral neck mass, subcutaneous, mobile   Chest and Lung Exam Chest and lung exam reveals -quiet, even and easy respiratory effort with no use of accessory muscles and on auscultation, normal breath sounds, no adventitious sounds and normal vocal resonance. Inspection Chest Wall - Normal. Back - normal.  Cardiovascular Cardiovascular examination reveals -normal heart sounds, regular rate and rhythm with no murmurs and normal pedal pulses bilaterally.  Abdomen Inspection Normal Exam - No Hernias. Skin - Scar - no surgical scars. Palpation/Percussion Normal exam - Soft, Non Tender, No Rebound tenderness, No Rigidity (guarding) and No hepatosplenomegaly. Auscultation Normal exam - Bowel sounds normal.  Neurologic Neurologic evaluation reveals -alert and oriented x 3 with no impairment of recent or remote memory. Mental  Status-Normal.    Assessment & Plan Katelyn Ok MD; 12/25/2015 10:04 AM) NECK MASS (R22.1) Impression: 45 year old female with a right subcutaneous neck mass  1. The patient would like to proceed For Excision of Right Neck Mass. 2. Discussed with Her the Risks and Benefits the Procedure to Include but Not Limited  to: Infection, Bleeding, Damage to Structures, Possible Recurrence. Patient voiced Understanding and Wishes to Proceed.

## 2016-05-14 DIAGNOSIS — R8299 Other abnormal findings in urine: Secondary | ICD-10-CM | POA: Diagnosis not present

## 2016-05-14 DIAGNOSIS — Z0000 Encounter for general adult medical examination without abnormal findings: Secondary | ICD-10-CM | POA: Diagnosis not present

## 2016-05-21 DIAGNOSIS — Z0000 Encounter for general adult medical examination without abnormal findings: Secondary | ICD-10-CM | POA: Diagnosis not present

## 2016-05-21 DIAGNOSIS — K5900 Constipation, unspecified: Secondary | ICD-10-CM | POA: Diagnosis not present

## 2016-05-21 DIAGNOSIS — Z1389 Encounter for screening for other disorder: Secondary | ICD-10-CM | POA: Diagnosis not present

## 2016-05-21 DIAGNOSIS — D1730 Benign lipomatous neoplasm of skin and subcutaneous tissue of unspecified sites: Secondary | ICD-10-CM | POA: Diagnosis not present

## 2016-05-21 DIAGNOSIS — Z6828 Body mass index (BMI) 28.0-28.9, adult: Secondary | ICD-10-CM | POA: Diagnosis not present

## 2016-05-21 DIAGNOSIS — Z23 Encounter for immunization: Secondary | ICD-10-CM | POA: Diagnosis not present

## 2016-05-21 DIAGNOSIS — D649 Anemia, unspecified: Secondary | ICD-10-CM | POA: Diagnosis not present

## 2016-06-10 ENCOUNTER — Ambulatory Visit: Payer: Self-pay | Admitting: General Surgery

## 2016-06-10 DIAGNOSIS — R221 Localized swelling, mass and lump, neck: Secondary | ICD-10-CM | POA: Diagnosis not present

## 2016-06-10 NOTE — H&P (Signed)
History of Present Illness Katelyn Ok MD; 06/10/2016 12:31 PM) The patient is a 46 year old female who presents with a complaint of Mass. Patient is a 46 year old female who follows back up today after recently being seen for a right neck mass. Patient states her some competition with her insurance and payment. Patient would like to proceed with surgery at this time. There is been minimal change to the mass. _______________________________________________ Patient represents today after been seen earlier this year. Patient masses continued without resolution. Patient states she is ready for surgery at this time.  ----------------------------------------------------------------------  Patient is a 46 year old female who comes in and is referred by Dr. Velna Hatchet for evaluation of a right neck mass. Patient states there for approximately 2 years. He states it has become more bothersome. She states there has been no drainage or infection.    Review of Systems Katelyn Ok, MD; 06/10/2016 12:32 PM) General Not Present- Appetite Loss, Chills, Fatigue, Fever, Night Sweats, Weight Gain and Weight Loss. Skin Not Present- Change in Wart/Mole, Dryness, Hives, Jaundice, New Lesions, Non-Healing Wounds, Rash and Ulcer. HEENT Not Present- Earache, Hearing Loss, Hoarseness, Nose Bleed, Oral Ulcers, Ringing in the Ears, Seasonal Allergies, Sinus Pain, Sore Throat, Visual Disturbances, Wears glasses/contact lenses and Yellow Eyes. Respiratory Not Present- Bloody sputum, Chronic Cough, Difficulty Breathing, Snoring and Wheezing. Breast Not Present- Breast Mass, Breast Pain, Nipple Discharge and Skin Changes. Gastrointestinal Present- Constipation. Not Present- Abdominal Pain, Bloating, Bloody Stool, Change in Bowel Habits, Chronic diarrhea, Difficulty Swallowing, Excessive gas, Gets full quickly at meals, Hemorrhoids, Indigestion, Nausea, Rectal Pain and Vomiting. Female Genitourinary Not  Present- Frequency, Nocturia, Painful Urination, Pelvic Pain and Urgency. Musculoskeletal Not Present- Back Pain, Joint Pain, Joint Stiffness, Muscle Pain, Muscle Weakness and Swelling of Extremities. Neurological Not Present- Decreased Memory, Fainting, Headaches, Numbness, Seizures, Tingling, Tremor, Trouble walking and Weakness. Psychiatric Not Present- Anxiety, Bipolar, Change in Sleep Pattern, Depression, Fearful and Frequent crying. Endocrine Not Present- Cold Intolerance, Excessive Hunger, Hair Changes, Heat Intolerance, Hot flashes and New Diabetes. Hematology Not Present- Easy Bruising, Excessive bleeding, Gland problems, HIV and Persistent Infections.   Physical Exam Katelyn Ok, MD; 06/10/2016 12:32 PM) General Mental Status-Alert. General Appearance-Consistent with stated age. Hydration-Well hydrated. Voice-Normal.  Head and Neck Note: Approximately 3-4 cm right lateral neck mass, subcutaneous, mobile   Chest and Lung Exam Chest and lung exam reveals -quiet, even and easy respiratory effort with no use of accessory muscles and on auscultation, normal breath sounds, no adventitious sounds and normal vocal resonance. Inspection Chest Wall - Normal. Back - normal.  Cardiovascular Cardiovascular examination reveals -normal heart sounds, regular rate and rhythm with no murmurs and normal pedal pulses bilaterally.  Abdomen Inspection Normal Exam - No Hernias. Skin - Scar - no surgical scars. Palpation/Percussion Normal exam - Soft, Non Tender, No Rebound tenderness, No Rigidity (guarding) and No hepatosplenomegaly. Auscultation Normal exam - Bowel sounds normal.  Neurologic Neurologic evaluation reveals -alert and oriented x 3 with no impairment of recent or remote memory. Mental Status-Normal.    Assessment & Plan Katelyn Ok MD; 06/10/2016 12:32 PM) NECK MASS (R22.1) Impression: 46 year old female with a right subcutaneous neck mass  1.  The patient would like to proceed For Excision of Right Neck Mass. 2. Discussed with Her the Risks and Benefits the Procedure to Include but Not Limited to: Infection, Bleeding, Damage to Structures, Possible Recurrence. Patient voiced Understanding and Wishes to Proceed.

## 2016-07-16 DIAGNOSIS — J302 Other seasonal allergic rhinitis: Secondary | ICD-10-CM | POA: Diagnosis not present

## 2016-07-16 DIAGNOSIS — J45909 Unspecified asthma, uncomplicated: Secondary | ICD-10-CM | POA: Diagnosis not present

## 2016-12-27 ENCOUNTER — Ambulatory Visit: Payer: Self-pay | Admitting: General Surgery

## 2016-12-27 DIAGNOSIS — R221 Localized swelling, mass and lump, neck: Secondary | ICD-10-CM | POA: Diagnosis not present

## 2016-12-27 NOTE — H&P (Signed)
History of Present Illness Katelyn Ok MD; 12/27/2016 5:09 PM) The patient is a 46 year old female who presents with a complaint of Mass. Patient is a 46 year old female who follows back up today after recently being seen for a right neck mass. Patient states her some competition with her insurance and payment. Patient would like to proceed with surgery at this time. There is been minimal change to the mass. _______________________________________________ Patient represents today after been seen earlier this year. Patient masses continued without resolution. Patient states she is ready for surgery at this time.  ----------------------------------------------------------------------  Patient is a 46 year old female who comes in and is referred by Dr. Velna Hatchet for evaluation of a right neck mass. Patient states there for approximately 2 years. He states it has become more bothersome. She states there has been no drainage or infection.   Allergies Erline Levine, RN; 12/27/2016 5:05 PM) No Known Drug Allergies 05/28/2015 Allergies Reconciled   Medication History Erline Levine, RN; 12/27/2016 5:05 PM) Ferrous Sulfate (325 (65 Fe)MG Tablet, Oral) Active. Medications Reconciled    Review of Systems Katelyn Ok, MD; 12/27/2016 5:9 PM) General Not Present- Appetite Loss, Chills, Fatigue, Fever, Night Sweats, Weight Gain and Weight Loss. Skin Not Present- Change in Wart/Mole, Dryness, Hives, Jaundice, New Lesions, Non-Healing Wounds, Rash and Ulcer. HEENT Not Present- Earache, Hearing Loss, Hoarseness, Nose Bleed, Oral Ulcers, Ringing in the Ears, Seasonal Allergies, Sinus Pain, Sore Throat, Visual Disturbances, Wears glasses/contact lenses and Yellow Eyes. Respiratory Not Present- Bloody sputum, Chronic Cough, Difficulty Breathing, Snoring and Wheezing. Breast Not Present- Breast Mass, Breast Pain, Nipple Discharge and Skin Changes. Gastrointestinal Present- Constipation. Not  Present- Abdominal Pain, Bloating, Bloody Stool, Change in Bowel Habits, Chronic diarrhea, Difficulty Swallowing, Excessive gas, Gets full quickly at meals, Hemorrhoids, Indigestion, Nausea, Rectal Pain and Vomiting. Female Genitourinary Not Present- Frequency, Nocturia, Painful Urination, Pelvic Pain and Urgency. Musculoskeletal Not Present- Back Pain, Joint Pain, Joint Stiffness, Muscle Pain, Muscle Weakness and Swelling of Extremities. Neurological Not Present- Decreased Memory, Fainting, Headaches, Numbness, Seizures, Tingling, Tremor, Trouble walking and Weakness. Psychiatric Not Present- Anxiety, Bipolar, Change in Sleep Pattern, Depression, Fearful and Frequent crying. Endocrine Not Present- Cold Intolerance, Excessive Hunger, Hair Changes, Heat Intolerance, Hot flashes and New Diabetes. Hematology Not Present- Easy Bruising, Excessive bleeding, Gland problems, HIV and Persistent Infections.  Vitals Erline Levine RN; 12/27/2016 5:04 PM) 12/27/2016 5:04 PM Weight: 177 lb Height: 67in Body Surface Area: 1.92 m Body Mass Index: 27.72 kg/m  Temp.: 98.38F(Oral)  Pulse: 73 (Regular)  P.OX: 97% (Room air) BP: 108/66 (Sitting, Left Arm, Standard)       Physical Exam Katelyn Ok, MD; 12/27/2016 5:9 PM) General Mental Status-Alert. General Appearance-Consistent with stated age. Hydration-Well hydrated. Voice-Normal.  Head and Neck Note: Approximately 3-4 cm right lateral neck mass, subcutaneous, mobile   Chest and Lung Exam Chest and lung exam reveals -quiet, even and easy respiratory effort with no use of accessory muscles and on auscultation, normal breath sounds, no adventitious sounds and normal vocal resonance. Inspection Chest Wall - Normal. Back - normal.  Cardiovascular Cardiovascular examination reveals -normal heart sounds, regular rate and rhythm with no murmurs and normal pedal pulses bilaterally.  Abdomen Inspection Normal Exam - No  Hernias. Skin - Scar - no surgical scars. Palpation/Percussion Normal exam - Soft, Non Tender, No Rebound tenderness, No Rigidity (guarding) and No hepatosplenomegaly. Auscultation Normal exam - Bowel sounds normal.  Neurologic Neurologic evaluation reveals -alert and oriented x 3 with no impairment of recent or remote  memory. Mental Status-Normal.    Assessment & Plan Katelyn Ok, MD; 12/27/2016 5:9 PM) NECK MASS (R22.1) Impression: 46 year old female with a right subcutaneous neck mass  1. The patient would like to proceed For Excision of Right Neck Mass. 2. Discussed with Her the Risks and Benefits the Procedure to Include but Not Limited to: Infection, Bleeding, Damage to Structures, Possible Recurrence. Patient voiced Understanding and Wishes to Proceed.

## 2017-01-27 ENCOUNTER — Encounter (HOSPITAL_COMMUNITY)
Admission: RE | Admit: 2017-01-27 | Discharge: 2017-01-27 | Disposition: A | Discharge status: Home / Self Care | Payer: BLUE CROSS/BLUE SHIELD | Source: Ambulatory Visit | Attending: General Surgery | Admitting: General Surgery

## 2017-01-27 ENCOUNTER — Encounter (HOSPITAL_COMMUNITY): Payer: Self-pay

## 2017-01-27 DIAGNOSIS — Z01818 Encounter for other preprocedural examination: Secondary | ICD-10-CM | POA: Insufficient documentation

## 2017-01-27 LAB — BASIC METABOLIC PANEL
Anion gap: 6 (ref 5–15)
BUN: 7 mg/dL (ref 6–20)
CALCIUM: 9.1 mg/dL (ref 8.9–10.3)
CO2: 24 mmol/L (ref 22–32)
CREATININE: 0.76 mg/dL (ref 0.44–1.00)
Chloride: 107 mmol/L (ref 101–111)
GFR calc Af Amer: >60 mL/min (ref >60)
Glucose, Bld: 96 mg/dL (ref 65–99)
POTASSIUM: 3.9 mmol/L (ref 3.5–5.1)
SODIUM: 137 mmol/L (ref 135–145)

## 2017-01-27 LAB — CBC
HCT: 34.3 % — ABNORMAL LOW (ref 36.0–46.0)
Hemoglobin: 12.1 g/dL (ref 12.0–15.0)
MCH: 26.8 pg (ref 26.0–34.0)
MCHC: 35.3 g/dL (ref 30.0–36.0)
MCV: 75.9 fL — ABNORMAL LOW (ref 78.0–100.0)
PLATELETS: 143 10*3/uL — AB (ref 150–400)
RBC: 4.52 MIL/uL (ref 3.87–5.11)
RDW: 14.2 % (ref 11.5–15.5)
WBC: 8.4 10*3/uL (ref 4.0–10.5)

## 2017-01-27 LAB — NO BLOOD PRODUCTS

## 2017-01-27 NOTE — Progress Notes (Signed)
Pt. Denies chest/ breathing concerns. Followed by Dr. Ardeth Perfect - PCP. Denies stress test &/or echo in the past.  Pt. Has had low H&H in the past ( post hysterectomy)- has been taking iron since then  .

## 2017-01-27 NOTE — Pre-Procedure Instructions (Signed)
Katelyn Lawson  01/27/2017      Your procedure is scheduled on Tuesday, October 30.  Report to [facility] Admitting at 12:45 PM                 Your surgery or procedure is scheduled for 2:45 PM   Call this number if you have problems the morning of surgery: [phone]    Remember:  Do not eat food or drink liquids after midnight Monday, October 29.  Take these medicines the morning of surgery with A SIP OF WATER :               Special instructions:   Katelyn Lawson- Preparing For Surgery  Before surgery, you can play an important role. Because skin is not sterile, your skin needs to be as free of germs as possible. You can reduce the number of germs on your skin by washing with CHG (chlorahexidine gluconate) Soap before surgery.  CHG is an antiseptic cleaner which kills germs and bonds with the skin to continue killing germs even after washing.  Please do not use if you have an allergy to CHG or antibacterial soaps. If your skin becomes reddened/irritated stop using the CHG.  Do not shave (including legs and underarms) for at least 48 hours prior to first CHG shower. It is OK to shave your face.  Please follow these instructions carefully.   1. Shower the NIGHT BEFORE SURGERY and the MORNING OF SURGERY with CHG.   2. If you chose to wash your hair, wash your hair first as usual with your normal shampoo.  3. After you shampoo, rinse your hair and body thoroughly to remove the shampoo.  4. Use CHG as you would any other liquid soap. You can apply CHG directly to the skin and wash gently with a scrungie or a clean washcloth.   5. Apply the CHG Soap to your body ONLY FROM THE NECK DOWN.  Do not use on open wounds or open sores. Avoid contact with your eyes, ears, mouth and genitals (private parts). Wash Face and genitals (private parts)  with your normal soap.  6. Wash thoroughly, paying special attention to the area where your surgery will be performed.  7. Thoroughly  rinse your body with warm water from the neck down.  8. DO NOT shower/wash with your normal soap after using and rinsing off the CHG Soap.  9. Pat yourself dry with a CLEAN TOWEL.  10. Wear CLEAN PAJAMAS to bed the night before surgery, wear comfortable clothes the morning of surgery  11. Place CLEAN SHEETS on your bed the night of your first shower and DO NOT SLEEP WITH PETS.  Day of Surgery: Shower as above Do not apply any deodorants/lotions. Please wear clean clothes to the hospital/surgery center.    Do not wear jewelry, make-up or nail polish.  Do not wear lotions, powders, or perfumes, or deoderant.  Do not shave 48 hours prior to surgery.  Men may shave face and neck.  Do not bring valuables to the hospital.  [facility] is not responsible for any belongings or valuables.  Contacts, dentures or bridgework may not be worn into surgery.  Leave your suitcase in the car.  After surgery it may be brought to your room.  For patients admitted to the hospital, discharge time will be determined by your treatment team.  Patients discharged the day of surgery will not be allowed to drive home.  Please read over the following fact sheets that you were given: Pain Booklet, Cough and Deep Breath, Surgical Site Infections.

## 2017-01-27 NOTE — Pre-Procedure Instructions (Signed)
Katelyn Lawson  01/27/2017      Your procedure is scheduled on Tuesday, October 30.  Report to [facility] Admitting at 12:45 PM                 Your surgery or procedure is scheduled for 2:45 PM   Call this number if you have problems the morning of surgery: [phone]    Remember:  Do not eat food or drink liquids after midnight Monday, October 29.  Take these medicines the morning of surgery with A SIP OF WATER :   NOTHING              Special instructions:   [facility]- Preparing For Surgery  Before surgery, you can play an important role. Because skin is not sterile, your skin needs to be as free of germs as possible. You can reduce the number of germs on your skin by washing with CHG (chlorahexidine gluconate) Soap before surgery.  CHG is an antiseptic cleaner which kills germs and bonds with the skin to continue killing germs even after washing.  Please do not use if you have an allergy to CHG or antibacterial soaps. If your skin becomes reddened/irritated stop using the CHG.  Do not shave (including legs and underarms) for at least 48 hours prior to first CHG shower. It is OK to shave your face.  Please follow these instructions carefully.   1. Shower the NIGHT BEFORE SURGERY and the MORNING OF SURGERY with CHG.   2. If you chose to wash your hair, wash your hair first as usual with your normal shampoo.  3. After you shampoo, rinse your hair and body thoroughly to remove the shampoo.  4. Use CHG as you would any other liquid soap. You can apply CHG directly to the skin and wash gently with a scrungie or a clean washcloth.   5. Apply the CHG Soap to your body ONLY FROM THE NECK DOWN.  Do not use on open wounds or open sores. Avoid contact with your eyes, ears, mouth and genitals (private parts). Wash Face and genitals (private parts)  with your normal soap.  6. Wash thoroughly, paying special attention to the area where your surgery will be  performed.  7. Thoroughly rinse your body with warm water from the neck down.  8. DO NOT shower/wash with your normal soap after using and rinsing off the CHG Soap.  9. Pat yourself dry with a CLEAN TOWEL.  10. Wear CLEAN PAJAMAS to bed the night before surgery, wear comfortable clothes the morning of surgery  11. Place CLEAN SHEETS on your bed the night of your first shower and DO NOT SLEEP WITH PETS.  Day of Surgery: Shower as above Do not apply any deodorants/lotions. Please wear clean clothes to the hospital/surgery center.    Do not wear jewelry, make-up or nail polish.  Do not wear lotions, powders, or perfumes, or deoderant.  Do not shave 48 hours prior to surgery.  Men may shave face and neck.  Do not bring valuables to the hospital.  [facility] is not responsible for any belongings or valuables.  Contacts, dentures or bridgework may not be worn into surgery.  Leave your suitcase in the car.  After surgery it may be brought to your room.  For patients admitted to the hospital, discharge time will be determined by your treatment team.  Patients discharged the day of surgery will not be allowed to drive home.  Please read over the following fact sheets that you were given: Pain Booklet, Cough and Deep Breath, Surgical Site Infections.

## 2017-02-01 ENCOUNTER — Ambulatory Visit (HOSPITAL_COMMUNITY)
Admission: RE | Admit: 2017-02-01 | Discharge: 2017-02-01 | Disposition: A | Discharge status: Home / Self Care | Payer: BLUE CROSS/BLUE SHIELD | Source: Ambulatory Visit | Attending: General Surgery | Admitting: General Surgery

## 2017-02-01 ENCOUNTER — Encounter (HOSPITAL_COMMUNITY): Payer: Self-pay | Admitting: General Practice

## 2017-02-01 ENCOUNTER — Ambulatory Visit (HOSPITAL_COMMUNITY): Payer: BLUE CROSS/BLUE SHIELD | Admitting: Anesthesiology

## 2017-02-01 ENCOUNTER — Encounter (HOSPITAL_COMMUNITY)
Admission: RE | Disposition: A | Discharge status: Home / Self Care | Payer: Self-pay | Source: Ambulatory Visit | Attending: General Surgery

## 2017-02-01 DIAGNOSIS — D696 Thrombocytopenia, unspecified: Secondary | ICD-10-CM | POA: Diagnosis not present

## 2017-02-01 DIAGNOSIS — J45909 Unspecified asthma, uncomplicated: Secondary | ICD-10-CM | POA: Diagnosis not present

## 2017-02-01 DIAGNOSIS — D170 Benign lipomatous neoplasm of skin and subcutaneous tissue of head, face and neck: Secondary | ICD-10-CM | POA: Insufficient documentation

## 2017-02-01 DIAGNOSIS — F419 Anxiety disorder, unspecified: Secondary | ICD-10-CM | POA: Diagnosis not present

## 2017-02-01 DIAGNOSIS — R221 Localized swelling, mass and lump, neck: Secondary | ICD-10-CM | POA: Diagnosis not present

## 2017-02-01 DIAGNOSIS — Z79899 Other long term (current) drug therapy: Secondary | ICD-10-CM | POA: Diagnosis not present

## 2017-02-01 HISTORY — DX: Anxiety disorder, unspecified: F41.9

## 2017-02-01 HISTORY — PX: MASS EXCISION: SHX2000

## 2017-02-01 HISTORY — DX: Unspecified asthma, uncomplicated: J45.909

## 2017-02-01 SURGERY — EXCISION MASS
Anesthesia: General | Site: Neck | Laterality: Right

## 2017-02-01 MED ORDER — MIDAZOLAM HCL 5 MG/5ML IJ SOLN
INTRAMUSCULAR | Status: DC | PRN
  Administered 2017-02-01 (×2): 2 mg via INTRAVENOUS

## 2017-02-01 MED ORDER — FENTANYL CITRATE (PF) 100 MCG/2ML IJ SOLN: 25.0000 ug | INTRAMUSCULAR | Status: DC | PRN

## 2017-02-01 MED ORDER — SODIUM CHLORIDE 0.9% FLUSH: 3.0000 mL | Freq: Two times a day (BID) | INTRAVENOUS | Status: DC

## 2017-02-01 MED ORDER — LIDOCAINE HCL (CARDIAC) 20 MG/ML IV SOLN
INTRAVENOUS | Status: DC | PRN
Start: 2017-02-01 — End: 2017-02-01
  Administered 2017-02-01: 40 mg via INTRAVENOUS

## 2017-02-01 MED ORDER — ONDANSETRON HCL 4 MG/2ML IJ SOLN
INTRAMUSCULAR | Status: AC
  Filled 2017-02-01: qty 2

## 2017-02-01 MED ORDER — OXYCODONE-ACETAMINOPHEN 5-325 MG PO TABS: 1.0000 | ORAL_TABLET | Freq: Four times a day (QID) | ORAL | 0 refills | Status: AC | PRN

## 2017-02-01 MED ORDER — CEFAZOLIN SODIUM-DEXTROSE 2-4 GM/100ML-% IV SOLN
2.0000 g | INTRAVENOUS | Status: AC
  Administered 2017-02-01: 2 g via INTRAVENOUS

## 2017-02-01 MED ORDER — OXYCODONE HCL 5 MG PO TABS: 5.0000 mg | ORAL_TABLET | ORAL | Status: DC | PRN

## 2017-02-01 MED ORDER — FENTANYL CITRATE (PF) 250 MCG/5ML IJ SOLN
INTRAMUSCULAR | Status: AC
  Filled 2017-02-01: qty 5

## 2017-02-01 MED ORDER — ACETAMINOPHEN 650 MG RE SUPP: 650.0000 mg | RECTAL | Status: DC | PRN

## 2017-02-01 MED ORDER — MIDAZOLAM HCL 2 MG/2ML IJ SOLN
INTRAMUSCULAR | Status: AC
  Filled 2017-02-01: qty 2

## 2017-02-01 MED ORDER — CEFAZOLIN SODIUM-DEXTROSE 2-4 GM/100ML-% IV SOLN
INTRAVENOUS | Status: AC
  Filled 2017-02-01: qty 100

## 2017-02-01 MED ORDER — FENTANYL CITRATE (PF) 100 MCG/2ML IJ SOLN
INTRAMUSCULAR | Status: DC | PRN
  Administered 2017-02-01: 25 ug via INTRAVENOUS
  Administered 2017-02-01: 50 ug via INTRAVENOUS

## 2017-02-01 MED ORDER — CHLORHEXIDINE GLUCONATE CLOTH 2 % EX PADS: 6.0000 | MEDICATED_PAD | Freq: Once | CUTANEOUS | Status: DC

## 2017-02-01 MED ORDER — DEXAMETHASONE SODIUM PHOSPHATE 10 MG/ML IJ SOLN
INTRAMUSCULAR | Status: AC
  Filled 2017-02-01: qty 1

## 2017-02-01 MED ORDER — MORPHINE SULFATE (PF) 2 MG/ML IV SOLN: 2.0000 mg | INTRAVENOUS | Status: DC | PRN

## 2017-02-01 MED ORDER — DEXAMETHASONE SODIUM PHOSPHATE 10 MG/ML IJ SOLN
INTRAMUSCULAR | Status: DC | PRN
  Administered 2017-02-01: 10 mg via INTRAVENOUS

## 2017-02-01 MED ORDER — PROPOFOL 10 MG/ML IV BOLUS
INTRAVENOUS | Status: DC | PRN
  Administered 2017-02-01: 160 mg via INTRAVENOUS

## 2017-02-01 MED ORDER — LACTATED RINGERS IV SOLN
INTRAVENOUS | Status: DC
  Administered 2017-02-01: 10:00:00 via INTRAVENOUS

## 2017-02-01 MED ORDER — ROCURONIUM BROMIDE 100 MG/10ML IV SOLN
INTRAVENOUS | Status: DC | PRN
  Administered 2017-02-01: 50 mg via INTRAVENOUS

## 2017-02-01 MED ORDER — ACETAMINOPHEN 325 MG PO TABS: 650.0000 mg | ORAL_TABLET | ORAL | Status: DC | PRN

## 2017-02-01 MED ORDER — 0.9 % SODIUM CHLORIDE (POUR BTL) OPTIME
TOPICAL | Status: DC | PRN
  Administered 2017-02-01: 1000 mL

## 2017-02-01 MED ORDER — BUPIVACAINE-EPINEPHRINE 0.25% -1:200000 IJ SOLN
INTRAMUSCULAR | Status: DC | PRN
  Administered 2017-02-01: 10 mL

## 2017-02-01 MED ORDER — PHENYLEPHRINE 40 MCG/ML (10ML) SYRINGE FOR IV PUSH (FOR BLOOD PRESSURE SUPPORT)
PREFILLED_SYRINGE | INTRAVENOUS | Status: DC | PRN
  Administered 2017-02-01 (×2): 80 ug via INTRAVENOUS

## 2017-02-01 MED ORDER — PROMETHAZINE HCL 25 MG/ML IJ SOLN: 6.2500 mg | INTRAMUSCULAR | Status: DC | PRN

## 2017-02-01 MED ORDER — SODIUM CHLORIDE 0.9% FLUSH: 3.0000 mL | INTRAVENOUS | Status: DC | PRN

## 2017-02-01 MED ORDER — SUGAMMADEX SODIUM 500 MG/5ML IV SOLN
INTRAVENOUS | Status: AC
  Filled 2017-02-01: qty 5

## 2017-02-01 MED ORDER — PHENYLEPHRINE 40 MCG/ML (10ML) SYRINGE FOR IV PUSH (FOR BLOOD PRESSURE SUPPORT)
PREFILLED_SYRINGE | INTRAVENOUS | Status: AC
  Filled 2017-02-01: qty 10

## 2017-02-01 MED ORDER — SODIUM CHLORIDE 0.9 % IV SOLN: 250.0000 mL | INTRAVENOUS | Status: DC | PRN

## 2017-02-01 MED ORDER — SUGAMMADEX SODIUM 500 MG/5ML IV SOLN
INTRAVENOUS | Status: DC | PRN
  Administered 2017-02-01: 100 mg via INTRAVENOUS
  Administered 2017-02-01: 400 mg via INTRAVENOUS

## 2017-02-01 MED ORDER — ONDANSETRON HCL 4 MG/2ML IJ SOLN
INTRAMUSCULAR | Status: DC | PRN
  Administered 2017-02-01: 4 mg via INTRAVENOUS

## 2017-02-01 MED ORDER — BUPIVACAINE-EPINEPHRINE (PF) 0.25% -1:200000 IJ SOLN
INTRAMUSCULAR | Status: AC
  Filled 2017-02-01: qty 30

## 2017-02-01 SURGICAL SUPPLY — 40 items
BENZOIN TINCTURE PRP APPL 2/3 (GAUZE/BANDAGES/DRESSINGS) ×2 IMPLANT
BLADE CLIPPER SURG (BLADE) IMPLANT
CANISTER SUCT 3000ML PPV (MISCELLANEOUS) IMPLANT
CHLORAPREP W/TINT 26ML (MISCELLANEOUS) ×2 IMPLANT
COVER SURGICAL LIGHT HANDLE (MISCELLANEOUS) ×2 IMPLANT
DERMABOND ADVANCED (GAUZE/BANDAGES/DRESSINGS) ×1
DERMABOND ADVANCED .7 DNX12 (GAUZE/BANDAGES/DRESSINGS) ×1 IMPLANT
DRAPE LAPAROTOMY 100X72 PEDS (DRAPES) IMPLANT
DRAPE ORTHO SPLIT 77X108 STRL (DRAPES)
DRAPE SURG ORHT 6 SPLT 77X108 (DRAPES) IMPLANT
ELECT CAUTERY BLADE 6.4 (BLADE) ×2 IMPLANT
ELECT REM PT RETURN 9FT ADLT (ELECTROSURGICAL) ×2
ELECTRODE REM PT RTRN 9FT ADLT (ELECTROSURGICAL) ×1 IMPLANT
GAUZE SPONGE 4X4 12PLY STRL (GAUZE/BANDAGES/DRESSINGS) ×2 IMPLANT
GLOVE BIO SURGEON STRL SZ7.5 (GLOVE) ×2 IMPLANT
GLOVE BIOGEL PI IND STRL 8 (GLOVE) ×1 IMPLANT
GLOVE BIOGEL PI INDICATOR 8 (GLOVE) ×1
GOWN STRL REUS W/ TWL LRG LVL3 (GOWN DISPOSABLE) ×1 IMPLANT
GOWN STRL REUS W/ TWL XL LVL3 (GOWN DISPOSABLE) ×1 IMPLANT
GOWN STRL REUS W/TWL LRG LVL3 (GOWN DISPOSABLE) ×1
GOWN STRL REUS W/TWL XL LVL3 (GOWN DISPOSABLE) ×1
KIT BASIN OR (CUSTOM PROCEDURE TRAY) ×2 IMPLANT
KIT ROOM TURNOVER OR (KITS) ×2 IMPLANT
NEEDLE HYPO 25GX1X1/2 BEV (NEEDLE) ×2 IMPLANT
NS IRRIG 1000ML POUR BTL (IV SOLUTION) ×2 IMPLANT
PACK SURGICAL SETUP 50X90 (CUSTOM PROCEDURE TRAY) ×2 IMPLANT
PAD ARMBOARD 7.5X6 YLW CONV (MISCELLANEOUS) ×2 IMPLANT
PENCIL BUTTON HOLSTER BLD 10FT (ELECTRODE) ×2 IMPLANT
SPECIMEN JAR SMALL (MISCELLANEOUS) ×2 IMPLANT
SPONGE LAP 18X18 X RAY DECT (DISPOSABLE) ×2 IMPLANT
STRIP CLOSURE SKIN 1/2X4 (GAUZE/BANDAGES/DRESSINGS) ×2 IMPLANT
SUT MNCRL AB 4-0 PS2 18 (SUTURE) ×2 IMPLANT
SUT VIC AB 3-0 SH 18 (SUTURE) ×2 IMPLANT
SYR BULB 3OZ (MISCELLANEOUS) ×2 IMPLANT
SYR CONTROL 10ML LL (SYRINGE) ×2 IMPLANT
TOWEL OR 17X24 6PK STRL BLUE (TOWEL DISPOSABLE) ×2 IMPLANT
TOWEL OR 17X26 10 PK STRL BLUE (TOWEL DISPOSABLE) ×2 IMPLANT
TUBE CONNECTING 12X1/4 (SUCTIONS) IMPLANT
UNDERPAD 30X30 (UNDERPADS AND DIAPERS) IMPLANT
YANKAUER SUCT BULB TIP NO VENT (SUCTIONS) IMPLANT

## 2017-02-01 NOTE — Anesthesia Preprocedure Evaluation (Signed)
Anesthesia Evaluation  Patient identified by MRN, date of birth, ID band Patient awake    Reviewed: Allergy & Precautions, NPO status , Patient's Chart, lab work & pertinent test results  Airway Mallampati: II  TM Distance: >3 FB Neck ROM: Full    Dental  (+) Teeth Intact, Dental Advisory Given   Pulmonary asthma ,    Pulmonary exam normal breath sounds clear to auscultation       Cardiovascular negative cardio ROS Normal cardiovascular exam Rhythm:Regular Rate:Normal     Neuro/Psych PSYCHIATRIC DISORDERS Anxiety negative neurological ROS     GI/Hepatic negative GI ROS, Neg liver ROS,   Endo/Other  negative endocrine ROS  Renal/GU negative Renal ROS     Musculoskeletal negative musculoskeletal ROS (+)   Abdominal   Peds  Hematology  (+) Blood dyscrasia (Thrombocytopenia), ,   Anesthesia Other Findings Day of surgery medications reviewed with the patient.  Reproductive/Obstetrics negative OB ROS                             Anesthesia Physical Anesthesia Plan  ASA: II  Anesthesia Plan: General   Post-op Pain Management:    Induction: Intravenous  PONV Risk Score and Plan: 3 and Ondansetron, Dexamethasone and Midazolam  Airway Management Planned: Oral ETT  Additional Equipment:   Intra-op Plan:   Post-operative Plan: Extubation in OR  Informed Consent: I have reviewed the patients History and Physical, chart, labs and discussed the procedure including the risks, benefits and alternatives for the proposed anesthesia with the patient or authorized representative who has indicated his/her understanding and acceptance.   Dental advisory given  Plan Discussed with: CRNA  Anesthesia Plan Comments: (Risks/benefits of general anesthesia discussed with patient including risk of damage to teeth, lips, gum, and tongue, nausea/vomiting, allergic reactions to medications, and the  possibility of heart attack, stroke and death.  All patient questions answered.  Patient wishes to proceed.)        Anesthesia Quick Evaluation

## 2017-02-01 NOTE — Op Note (Signed)
02/01/2017  11:46 AM  PATIENT:  Katelyn Lawson  46 y.o. female  PRE-OPERATIVE DIAGNOSIS:  NECK MASS   POST-OPERATIVE DIAGNOSIS:  NECK MASS   PROCEDURE:  Procedure(s): EXCISION OF 5cm X 3cm RIGHT LATERAL NECK MASS (Right)  SURGEON:  Surgeon(s) and Role:    Ralene Ok, MD - Primary  ANESTHESIA:   local and general  EBL:  minimal   BLOOD ADMINISTERED:none  DRAINS: none   LOCAL MEDICATIONS USED:  BUPIVICAINE   SPECIMEN:  Source of Specimen: Lateral right neck mass, 5 x 3 cm  DISPOSITION OF SPECIMEN:  PATHOLOGY  COUNTS:  YES  TOURNIQUET:  * No tourniquets in log *  DICTATION: .Dragon Dictation  Indication for procedure: Patient is a 46 year old female with a right lateral neck mass.  Patient states this was causing her some discomfort as well as increase in size.  Secondary to that she had this electively removed.  Details of procedure:  After the patient was consented she was taken back to the operating room and placed in supine position with bilateral SCDs in place.  After appropriate antibiotics was timeout was called off fax were verified.  At this time a 3 cm oblique incision was made over the area of the mass.  Cautery was used to maintain hemostasis and dissection was taken down to the fatty mass.  This was circumferentially dissected away.  The mass was in the subcutaneous space.  Once this was circumferentially dissected away this was delivered into the incision site.  The mass was measured to be approximately 5 x 3 cm.  At this time and checked for hemostasis which was excellent.  At this time 3-0 Vicryls were used in interrupted fashion to reapproximate the dermal layer.  4-0 Monocryl were used to reapproximate the skin in a subcuticular fashion.  The skin was dressed with Dermabond.  The area was injected with quarter percent Marcaine with epi.  The patient tired procedure well was taken to the recovery in stable condition.    PLAN OF CARE: Discharge to home  after PACU  PATIENT DISPOSITION:  PACU - hemodynamically stable.   Delay start of Pharmacological VTE agent (>24hrs) due to surgical blood loss or risk of bleeding: not applicable

## 2017-02-01 NOTE — Anesthesia Procedure Notes (Signed)
Procedure Name: Intubation Date/Time: 02/01/2017 11:17 AM Performed by: Kyung Rudd Pre-anesthesia Checklist: Patient identified, Emergency Drugs available, Suction available and Patient being monitored Patient Re-evaluated:Patient Re-evaluated prior to induction Oxygen Delivery Method: Circle system utilized Preoxygenation: Pre-oxygenation with 100% oxygen Induction Type: IV induction Ventilation: Mask ventilation without difficulty and Oral airway inserted - appropriate to patient size Laryngoscope Size: Mac and 3 Grade View: Grade I Tube type: Oral Tube size: 7.0 mm Number of attempts: 1 Airway Equipment and Method: Stylet Placement Confirmation: ETT inserted through vocal cords under direct vision,  positive ETCO2 and breath sounds checked- equal and bilateral Secured at: 19 cm Tube secured with: Tape Dental Injury: Teeth and Oropharynx as per pre-operative assessment and Injury to lip  Comments: AOI per Dara Lords, paramedic with Dr. Seward Speck supervising.

## 2017-02-01 NOTE — Transfer of Care (Signed)
Immediate Anesthesia Transfer of Care Note  Patient: Katelyn Lawson  Procedure(s) Performed: EXCISION OF 5cm X 3cm RIGHT LATERAL NECK MASS (Right Neck)  Patient Location: PACU  Anesthesia Type:General  Level of Consciousness: awake, alert  and oriented  Airway & Oxygen Therapy: Patient Spontanous Breathing and Patient connected to nasal cannula oxygen  Post-op Assessment: Report given to RN, Post -op Vital signs reviewed and stable and Patient moving all extremities X 4  Post vital signs: Reviewed and stable  Last Vitals:  Vitals:   02/01/17 0922 02/01/17 1208  BP: 120/73 113/61  Pulse: 64 65  Resp: 20 17  Temp: 36.7 C   SpO2: 100% 95%    Last Pain:  Vitals:   02/01/17 1208  TempSrc:   PainSc: Asleep      Patients Stated Pain Goal: 2 (28/00/34 9179)  Complications: No apparent anesthesia complications

## 2017-02-01 NOTE — H&P (Signed)
History of Present Illness  The patient is a 46 year old female who presents with a complaint of Mass. Patient is a 46 year old female who follows back up today after recently being seen for a right neck mass. Patient states her some competition with her insurance and payment. Patient would like to proceed with surgery at this time. There is been minimal change to the mass. _______________________________________________ Patient represents today after been seen earlier this year. Patient masses continued without resolution. Patient states she is ready for surgery at this time.  ----------------------------------------------------------------------  Patient is a 46 year old female who comes in and is referred by Dr. Velna Hatchet for evaluation of a right neck mass. Patient states there for approximately 2 years. He states it has become more bothersome. She states there has been no drainage or infection.   Allergies No Known Drug Allergies 05/28/2015 Allergies Reconciled   Medication History Ferrous Sulfate (325 (65 Fe)MG Tablet, Oral) Active. Medications Reconciled    Review of Systems  General Not Present- Appetite Loss, Chills, Fatigue, Fever, Night Sweats, Weight Gain and Weight Loss. Skin Not Present- Change in Wart/Mole, Dryness, Hives, Jaundice, New Lesions, Non-Healing Wounds, Rash and Ulcer. HEENT Not Present- Earache, Hearing Loss, Hoarseness, Nose Bleed, Oral Ulcers, Ringing in the Ears, Seasonal Allergies, Sinus Pain, Sore Throat, Visual Disturbances, Wears glasses/contact lenses and Yellow Eyes. Respiratory Not Present- Bloody sputum, Chronic Cough, Difficulty Breathing, Snoring and Wheezing. Breast Not Present- Breast Mass, Breast Pain, Nipple Discharge and Skin Changes. Gastrointestinal Present- Constipation. Not Present- Abdominal Pain, Bloating, Bloody Stool, Change in Bowel Habits, Chronic diarrhea, Difficulty Swallowing, Excessive gas, Gets full  quickly at meals, Hemorrhoids, Indigestion, Nausea, Rectal Pain and Vomiting. Female Genitourinary Not Present- Frequency, Nocturia, Painful Urination, Pelvic Pain and Urgency. Musculoskeletal Not Present- Back Pain, Joint Pain, Joint Stiffness, Muscle Pain, Muscle Weakness and Swelling of Extremities. Neurological Not Present- Decreased Memory, Fainting, Headaches, Numbness, Seizures, Tingling, Tremor, Trouble walking and Weakness. Psychiatric Not Present- Anxiety, Bipolar, Change in Sleep Pattern, Depression, Fearful and Frequent crying. Endocrine Not Present- Cold Intolerance, Excessive Hunger, Hair Changes, Heat Intolerance, Hot flashes and New Diabetes. Hematology Not Present- Easy Bruising, Excessive bleeding, Gland problems, HIV and Persistent Infections.  BP 120/73   Pulse 64   Temp 98.1 F (36.7 C) (Oral)   Resp 20   Ht 5\' 7"  (1.702 m)   Wt 82.6 kg (182 lb)   LMP 06/18/2014 (Approximate)   SpO2 100%   BMI 28.51 kg/m      Physical Exam General Mental Status-Alert. General Appearance-Consistent with stated age. Hydration-Well hydrated. Voice-Normal.  Head and Neck Note: Approximately 3-4 cm right lateral neck mass, subcutaneous, mobile   Chest and Lung Exam Chest and lung exam reveals -quiet, even and easy respiratory effort with no use of accessory muscles and on auscultation, normal breath sounds, no adventitious sounds and normal vocal resonance. Inspection Chest Wall - Normal. Back - normal.  Cardiovascular Cardiovascular examination reveals -normal heart sounds, regular rate and rhythm with no murmurs and normal pedal pulses bilaterally.  Abdomen Inspection Normal Exam - No Hernias. Skin - Scar - no surgical scars. Palpation/Percussion Normal exam - Soft, Non Tender, No Rebound tenderness, No Rigidity (guarding) and No hepatosplenomegaly. Auscultation Normal exam - Bowel sounds normal.  Neurologic Neurologic evaluation reveals  -alert and oriented x 3 with no impairment of recent or remote memory. Mental Status-Normal.    Assessment & Plan NECK MASS (R22.1) Impression: 46 year old female with a right subcutaneous neck mass  1. The patient would  like to proceed For Excision of Right Neck Mass. 2. Discussed with Her the Risks and Benefits the Procedure to Include but Not Limited to: Infection, Bleeding, Damage to Structures, Possible Recurrence. Patient voiced Understanding and Wishes to Proceed.

## 2017-02-01 NOTE — Discharge Instructions (Signed)
Lipoma Removal, Care After  Refer to this sheet in the next few weeks. These instructions provide you with information about caring for yourself after your procedure. Your health care provider may also give you more specific instructions. Your treatment has been planned according to current medical practices, but problems sometimes occur. Call your health care provider if you have any problems or questions after your procedure.  What can I expect after the procedure?  After the procedure, it is common to have:  · Mild pain.  · Swelling.  · Bruising.    Follow these instructions at home:    Bathing  · Do not take baths, swim, or use a hot tub until your health care provider approves. Ask your health care provider if you can take showers. You may only be allowed to take sponge baths for bathing.  · Keep your bandage (dressing) dry until your health care provider says it can be removed.  Incision care    · Follow instructions from your health care provider about how to take care of your incision. Make sure you:  ? Wash your hands with soap and water before you change your bandage (dressing). If soap and water are not available, use hand sanitizer.  ? Change your dressing as told by your health care provider.  ? Leave stitches (sutures), skin glue, or adhesive strips in place. These skin closures may need to stay in place for 2 weeks or longer. If adhesive strip edges start to loosen and curl up, you may trim the loose edges. Do not remove adhesive strips completely unless your health care provider tells you to do that.  · Check your incision area every day for signs of infection. Check for:  ? More redness, swelling, or pain.  ? Fluid or blood.  ? Warmth.  ? Pus or a bad smell.  Driving  · Do not drive or operate heavy machinery while taking prescription pain medicine.  · Do not drive for 24 hours if you received a medicine to help you relax (sedative) during your procedure.  · Ask your health care provider when it is  safe for you to drive.  General instructions  · Take over-the-counter and prescription medicines only as told by your health care provider.  · Do not use any tobacco products, such as cigarettes, chewing tobacco, and e-cigarettes. These can delay healing. If you need help quitting, ask your health care provider.  · Return to your normal activities as told by your health care provider. Ask your health care provider what activities are safe for you.  · Keep all follow-up visits as told by your health care provider. This is important.  Contact a health care provider if:  · You have more redness, swelling, or pain around your incision.  · You have fluid or blood coming from your incision.  · Your incision feels warm to the touch.  · You have pus or a bad smell coming from your incision.  · You have pain that does not get better with medicine.  Get help right away if:  · You have chills or a fever.  · You have severe pain.  This information is not intended to replace advice given to you by your health care provider. Make sure you discuss any questions you have with your health care provider.  Document Released: 06/05/2015 Document Revised: 09/02/2015 Document Reviewed: 06/05/2015  Elsevier Interactive Patient Education © 2018 [employer]

## 2017-02-02 ENCOUNTER — Encounter (HOSPITAL_COMMUNITY): Payer: Self-pay | Admitting: General Surgery

## 2017-02-02 NOTE — Anesthesia Postprocedure Evaluation (Signed)
Anesthesia Post Note  Patient: Katelyn Lawson  Procedure(s) Performed: EXCISION OF 5cm X 3cm RIGHT LATERAL NECK MASS (Right Neck)     Patient location during evaluation: PACU Anesthesia Type: General Level of consciousness: awake and alert Pain management: pain level controlled Vital Signs Assessment: post-procedure vital signs reviewed and stable Respiratory status: spontaneous breathing, nonlabored ventilation, respiratory function stable and patient connected to nasal cannula oxygen Cardiovascular status: blood pressure returned to baseline and stable Postop Assessment: no apparent nausea or vomiting Anesthetic complications: no    Last Vitals:  Vitals:   02/01/17 1308 02/01/17 1312  BP: 107/76 120/72  Pulse: (!) 58 63  Resp: 17 17  Temp:  36.8 C  SpO2: 98% 100%    Last Pain:  Vitals:   02/01/17 1312  TempSrc:   PainSc: 0-No pain                 Krystyl Cannell EDWARD

## 2017-05-18 DIAGNOSIS — Z0000 Encounter for general adult medical examination without abnormal findings: Secondary | ICD-10-CM | POA: Diagnosis not present

## 2017-05-18 DIAGNOSIS — R82998 Other abnormal findings in urine: Secondary | ICD-10-CM | POA: Diagnosis not present

## 2017-05-25 DIAGNOSIS — Z0000 Encounter for general adult medical examination without abnormal findings: Secondary | ICD-10-CM | POA: Diagnosis not present

## 2017-05-25 DIAGNOSIS — Z6829 Body mass index (BMI) 29.0-29.9, adult: Secondary | ICD-10-CM | POA: Diagnosis not present

## 2017-05-25 DIAGNOSIS — Z1389 Encounter for screening for other disorder: Secondary | ICD-10-CM | POA: Diagnosis not present

## 2017-05-25 DIAGNOSIS — M25511 Pain in right shoulder: Secondary | ICD-10-CM | POA: Diagnosis not present

## 2018-01-16 DIAGNOSIS — S29012A Strain of muscle and tendon of back wall of thorax, initial encounter: Secondary | ICD-10-CM | POA: Diagnosis not present

## 2018-04-27 DIAGNOSIS — M62838 Other muscle spasm: Secondary | ICD-10-CM | POA: Diagnosis not present

## 2018-04-27 DIAGNOSIS — Z0000 Encounter for general adult medical examination without abnormal findings: Secondary | ICD-10-CM | POA: Diagnosis not present

## 2018-05-18 DIAGNOSIS — Z1231 Encounter for screening mammogram for malignant neoplasm of breast: Secondary | ICD-10-CM | POA: Diagnosis not present

## 2018-05-18 DIAGNOSIS — Z0000 Encounter for general adult medical examination without abnormal findings: Secondary | ICD-10-CM | POA: Diagnosis not present

## 2019-08-01 ENCOUNTER — Ambulatory Visit (INDEPENDENT_AMBULATORY_CARE_PROVIDER_SITE_OTHER): Payer: 59 | Admitting: Adult Health Nurse Practitioner

## 2019-08-01 ENCOUNTER — Other Ambulatory Visit: Payer: Self-pay

## 2019-08-01 ENCOUNTER — Encounter: Payer: Self-pay | Admitting: Adult Health Nurse Practitioner

## 2019-08-01 VITALS — BP 142/74 | HR 77 | Temp 98.0°F | Ht 66.0 in | Wt 179.6 lb

## 2019-08-01 DIAGNOSIS — J9801 Acute bronchospasm: Secondary | ICD-10-CM | POA: Diagnosis not present

## 2019-08-01 DIAGNOSIS — Z23 Encounter for immunization: Secondary | ICD-10-CM | POA: Diagnosis not present

## 2019-08-01 DIAGNOSIS — Z7689 Persons encountering health services in other specified circumstances: Secondary | ICD-10-CM | POA: Insufficient documentation

## 2019-08-01 DIAGNOSIS — R05 Cough: Secondary | ICD-10-CM

## 2019-08-01 DIAGNOSIS — R059 Cough, unspecified: Secondary | ICD-10-CM

## 2019-08-01 MED ORDER — FEXOFENADINE HCL 180 MG PO TABS: 180.0000 mg | ORAL_TABLET | Freq: Every day | ORAL | 3 refills | Status: AC | PRN

## 2019-08-01 MED ORDER — ALBUTEROL SULFATE HFA 108 (90 BASE) MCG/ACT IN AERS: 2.0000 | INHALATION_SPRAY | Freq: Four times a day (QID) | RESPIRATORY_TRACT | 5 refills | Status: AC | PRN

## 2019-08-01 NOTE — Patient Instructions (Signed)
° ° ° °  If you have lab work done today you will be contacted with your lab results within the next 2 weeks.  If you have not heard from us then please contact us. The fastest way to get your results is to register for My Chart. ° ° °IF you received an x-ray today, you will receive an invoice from [facility]. Please contact [facility] at [phone] with questions or concerns regarding your invoice.  ° °IF you received labwork today, you will receive an invoice from [facility]. Please contact [facility] at [phone] with questions or concerns regarding your invoice.  ° °Our billing staff will not be able to assist you with questions regarding bills from these companies. ° °You will be contacted with the lab results as soon as they are available. The fastest way to get your results is to activate your My Chart account. Instructions are located on the last page of this paperwork. If you have not heard from us regarding the results in 2 weeks, please contact this office. °  ° ° ° °

## 2019-08-01 NOTE — Progress Notes (Signed)
Chief Complaint  Patient presents with  . New Patient (Initial Visit)    coughing x1 week     HPI   Patient presents to establish care.  She is new to myself and clinic.  Insurance changed and patient needs refills of Albuterol and Allegra due to seasonal allergies and dry cough x 1 week.  Dry cough not associated with any congestion, sore throat, or ear pain.  Not keeping patient up at night.  Not associated with SOB or wheezing.    Problem List    Problem List: 2016-03: S/P hysterectomy   Allergies   has No Known Allergies.  Medications    Current Outpatient Medications:  .  albuterol (VENTOLIN HFA) 108 (90 Base) MCG/ACT inhaler, albuterol sulfate HFA 90 mcg/actuation aerosol inhaler, Disp: , Rfl:  .  ferrous sulfate 325 (65 FE) MG tablet, Take 325 mg by mouth daily with breakfast., Disp: , Rfl:  .  fexofenadine (ALLEGRA) 180 MG tablet, Take 1 tablet (180 mg total) by mouth daily as needed for allergies. Not taking, Disp: 30 tablet, Rfl: 3 .  albuterol (VENTOLIN HFA) 108 (90 Base) MCG/ACT inhaler, Inhale 2 puffs into the lungs every 6 (six) hours as needed for wheezing or shortness of breath., Disp: 8 g, Rfl: 5 .  Ascorbic Acid (VITAMIN C PO), Take 1 tablet by mouth daily., Disp: , Rfl:  .  Cyanocobalamin (VITAMIN B-12 PO), Take 1 tablet by mouth daily., Disp: , Rfl:  .  docusate sodium (COLACE) 100 MG capsule, Take 1 capsule (100 mg total) by mouth 2 (two) times daily. (Patient not taking: Reported on 08/01/2019), Disp: 10 capsule, Rfl: 0   Review of Systems    Constitutional: Negative for activity change, appetite change, chills and fever.  HENT: Negative for congestion, nosebleeds, trouble swallowing and voice change.   Respiratory: Negative for cough, shortness of breath and wheezing.   Cardiac:  Negative for chest pain, pressure, syncope  Gastrointestinal: Negative for diarrhea, nausea and vomiting.  Genitourinary: Negative for difficulty urinating, dysuria, flank pain  and hematuria.  Musculoskeletal: Negative for back pain, joint swelling and neck pain.  Neurological: Negative for dizziness, speech difficulty, light-headedness and numbness.  See HPI. All other review of systems negative.     Physical Exam:    height is 5\' 6"  (1.676 m) and weight is 179 lb 9.6 oz (81.5 kg). Her temporal temperature is 98 F (36.7 C). Her blood pressure is 142/74 (abnormal) and her pulse is 77. Her oxygen saturation is 98%.   Physical Examination: General appearance - alert, well appearing, and in no distress and oriented to person, place, and time Mental status - normal mood, behavior, speech, dress, motor activity, and thought processes Eyes - PERRL. Extraocular movements intact.  No nystagmus.  Neck - supple, no significant adenopathy, carotids upstroke normal bilaterally, no bruits, thyroid exam: thyroid is normal in size without nodules or tenderness Chest - clear to auscultation, no wheezes, rales or rhonchi, symmetric air entry  Heart - normal rate, regular rhythm, normal S1, S2, no murmurs, rubs, clicks or gallops Extremities - dependent LE edema without clubbing or cyanosis Skin - normal coloration and turgor, no rashes, no suspicious skin lesions noted  No hyperpigmentation of skin.  No current hematomas noted   Lab /Imaging Review   No labs to review   Assessment & Plan:  Katelyn Lawson is a 49 y.o. female    1. Establishing care with new doctor, encounter for   2. Need for prophylactic  vaccination with combined diphtheria-tetanus-pertussis (DTP) vaccine   3. Cough   4. Bronchospasm    Orders Placed This Encounter  Procedures  . POCT urinalysis dipstick   Meds ordered this encounter  Medications  . albuterol (VENTOLIN HFA) 108 (90 Base) MCG/ACT inhaler    Sig: Inhale 2 puffs into the lungs every 6 (six) hours as needed for wheezing or shortness of breath.    Dispense:  8 g    Refill:  5  . fexofenadine (ALLEGRA) 180 MG tablet    Sig: Take 1 tablet  (180 mg total) by mouth daily as needed for allergies. Not taking    Dispense:  30 tablet    Refill:  3   F/U prn.   Glyn Ade, NP
